# Patient Record
Sex: Female | Born: 1965 | Race: White | Hispanic: No | Smoking: Never smoker
Health system: Southern US, Community
[De-identification: ages and names within clinical notes are randomized; demographics above are authoritative.]

## PROBLEM LIST (undated history)

## (undated) DIAGNOSIS — I82409 Acute embolism and thrombosis of unspecified deep veins of unspecified lower extremity: Secondary | ICD-10-CM

## (undated) HISTORY — PX: LAPAROSCOPY: SHX197

## (undated) HISTORY — DX: Acute embolism and thrombosis of unspecified deep veins of unspecified lower extremity: I82.409

---

## 2007-10-31 ENCOUNTER — Other Ambulatory Visit: Admission: RE | Admit: 2007-10-31 | Discharge: 2007-10-31 | Payer: Self-pay | Admitting: Obstetrics and Gynecology

## 2008-03-01 HISTORY — PX: BREAST ENHANCEMENT SURGERY: SHX7

## 2008-11-04 ENCOUNTER — Other Ambulatory Visit: Admission: RE | Admit: 2008-11-04 | Discharge: 2008-11-04 | Payer: Self-pay | Admitting: Obstetrics and Gynecology

## 2012-10-17 ENCOUNTER — Encounter: Payer: Self-pay | Admitting: Certified Nurse Midwife

## 2012-10-22 ENCOUNTER — Ambulatory Visit (INDEPENDENT_AMBULATORY_CARE_PROVIDER_SITE_OTHER): Payer: BC Managed Care – PPO | Admitting: Certified Nurse Midwife

## 2012-10-22 ENCOUNTER — Encounter: Payer: Self-pay | Admitting: Certified Nurse Midwife

## 2012-10-22 VITALS — BP 110/64 | HR 68 | Resp 16

## 2012-10-22 DIAGNOSIS — R319 Hematuria, unspecified: Secondary | ICD-10-CM

## 2012-10-22 DIAGNOSIS — N390 Urinary tract infection, site not specified: Secondary | ICD-10-CM

## 2012-10-22 LAB — POCT URINALYSIS DIPSTICK
Leukocytes, UA: NEGATIVE
Nitrite, UA: NEGATIVE
Protein, UA: NEGATIVE
Urobilinogen, UA: NEGATIVE
pH, UA: 5

## 2012-10-22 NOTE — Progress Notes (Addendum)
47 yo dwf [id number] here for f/u of E. Coli UTI treated with Cipro initiated on 09-10-2012.  Completed all me.d as directed.   Denies urinary frequency,urgency or pain.  Has increased water intake which has helped.  Does wear thong underwear, but has had no problems in the past. Sexually active but no concerns.  Empties bladder after sexual activity.  LMP:10-22-2012  O: Healthy female, WD WN  Alert, oriented x 3. HPI pertinent to above Skin: warm and dry CVAT: neg bilaterally Abdomen: soft, non-tender, negative suprapubic tenderness  No pelvic exam requested on menses  A: UTI probably resolved (E. Coli) UA reviewed neg.  P: Urine culture Continue to increase water daily to avoid UTI's RV prn Aware of UTI warning signs and symptoms Reviewed, TL

## 2012-12-12 ENCOUNTER — Ambulatory Visit: Payer: Self-pay | Admitting: Certified Nurse Midwife

## 2012-12-20 ENCOUNTER — Ambulatory Visit (INDEPENDENT_AMBULATORY_CARE_PROVIDER_SITE_OTHER): Payer: BC Managed Care – PPO | Admitting: Certified Nurse Midwife

## 2012-12-20 ENCOUNTER — Encounter: Payer: Self-pay | Admitting: Certified Nurse Midwife

## 2012-12-20 VITALS — BP 100/64 | Ht 66.75 in | Wt 173.0 lb

## 2012-12-20 DIAGNOSIS — Z0000 Encounter for general adult medical examination without abnormal findings: Secondary | ICD-10-CM

## 2012-12-20 DIAGNOSIS — Z01419 Encounter for gynecological examination (general) (routine) without abnormal findings: Secondary | ICD-10-CM

## 2012-12-20 LAB — LIPID PANEL
HDL: 53 mg/dL (ref >39)
Total CHOL/HDL Ratio: 2.8 Ratio
VLDL: 19 mg/dL (ref 0–40)

## 2012-12-20 LAB — POCT URINALYSIS DIPSTICK
Bilirubin, UA: NEGATIVE
Blood, UA: NEGATIVE
Ketones, UA: NEGATIVE
Leukocytes, UA: NEGATIVE
pH, UA: 5

## 2012-12-20 NOTE — Patient Instructions (Addendum)

## 2012-12-20 NOTE — Progress Notes (Signed)
47 y.o. G70P0010 Divorced Caucasian Fe here for annual exam.  Periods normal, no issues.  No partner change, no STD screening desired. Using diaphragm for contraception, brought with her today to make sure replacement not necessary.  No problems with fit or removal. Urgent care for problems prn.Fasted for lab work today.   No health issues today  Patient's last menstrual period was 12/11/2012.          Sexually active: yes  The current method of family planning is diaphragm.    Exercising: yes  walk & the gym Smoker:  no  Health Maintenance: Pap:  12-08-11 neg HPV HR neg MMG:  4/14 Colonoscopy:  none BMD:   none TDaP:  2013 Labs: Poct urine-neg Self breast exam: not done   reports that she has never smoked. She does not have any smokeless tobacco history on file. She reports that she does not drink alcohol or use illicit drugs.  Past Medical History  Diagnosis Date  . DVT (deep venous thrombosis)     rt calf treatment 8months due to oc's    Past Surgical History  Procedure Laterality Date  . Breast enhancement surgery Bilateral 8/09    saline  . Laparoscopy      No current outpatient prescriptions on file.   No current facility-administered medications for this visit.    Family History  Problem Relation Age of Onset  . Heart disease Mother   . Hodgkin's lymphoma Father     ROS:  Pertinent items are noted in HPI.  Otherwise, a comprehensive ROS was negative.  Exam:   BP 100/64  Ht 5' 6.75" (1.695 m)  Wt 173 lb (78.472 kg)  BMI 27.31 kg/m2  LMP 12/11/2012 Height: 5' 6.75" (169.5 cm)  Ht Readings from Last 3 Encounters:  12/20/12 5' 6.75" (1.695 m)    General appearance: alert, cooperative and appears stated age Head: Normocephalic, without obvious abnormality, atraumatic Neck: no adenopathy, supple, symmetrical, trachea midline and thyroid normal to inspection and palpation Lungs: clear to auscultation bilaterally Breasts: normal appearance, no masses or  tenderness, No nipple retraction or dimpling, No nipple discharge or bleeding, No axillary or supraclavicular adenopathy, implants appear intact Heart: regular rate and rhythm Abdomen: soft, non-tender; no masses,  no organomegaly Extremities: extremities normal, atraumatic, no cyanosis or edema Skin: Skin color, texture, turgor normal. No rashes or lesions Lymph nodes: Cervical, supraclavicular, and axillary nodes normal. No abnormal inguinal nodes palpated Neurologic: Grossly normal   Pelvic: External genitalia:  no lesions              Urethra:  normal appearing urethra with no masses, tenderness or lesions              Bartholin's and Skene's: normal                 Vagina: normal appearing vagina with normal color and discharge, no lesions              Cervix: normal non tender              Pap taken: no Bimanual Exam:  Uterus:  normal size, contour, position, consistency, mobility, non-tender and anteverted              Adnexa: normal adnexa and no mass, fullness, tenderness               Rectovaginal: Confirms               Anus:  normal sphincter  tone, no lesions  A:  Well Woman with normal exam  Contraception: Diaphragm working well    P:  Reviewed health and wellness pertinent to exam  Checked diaphragm, reassured patient does not   need replacement at this time  Labs : Lipid panel, TSH  Pap smear as per guidelines   Mammogram yearly pap smear not taken today  counseled on breast self exam, adequate intake of calcium and vitamin D, diet and exercise  return annually or prn  An After Visit Summary was printed and given to the patient.  Reviewed, TL

## 2012-12-21 LAB — TSH: TSH: 1.845 u[IU]/mL (ref 0.350–4.500)

## 2012-12-25 ENCOUNTER — Telehealth: Payer: Self-pay

## 2012-12-25 NOTE — Telephone Encounter (Signed)
lmtcb

## 2012-12-25 NOTE — Telephone Encounter (Signed)
Message copied by Eliezer Bottom on Tue Dec 25, 2012  2:01 PM ------      Message from: Verner Chol      Created: Sun Dec 23, 2012  2:33 PM       Notify lipid panel excellent      TSH normal ------

## 2012-12-25 NOTE — Telephone Encounter (Signed)
Patient notified of results.

## 2013-01-24 ENCOUNTER — Telehealth: Payer: Self-pay | Admitting: Certified Nurse Midwife

## 2013-01-24 NOTE — Telephone Encounter (Signed)
Patient called C/O having 2 missed in a month . Please call and advise patient.

## 2013-01-24 NOTE — Telephone Encounter (Signed)
Spoke with pt who has had 2 periods this month. Pt says she is perimenopausal and knows this may be to be expected at her age. First period was the first week of June and it was a normal one. Second one started Sunday. Started out as dark blood, then bright red but very light flow, tapering down to spotting today. Pt reports she feels tired and run down, but otherwise OK. Wanted DL to know and for it to be documented. Is this to be expected? Any advice?

## 2013-01-29 NOTE — Telephone Encounter (Signed)
Spoke with pt to advise her that DL would like her to come in for OV. Pt wondering why DL thinks she needs to come in. Pt thought irregular cycles were to be expected in perimenopause. She mostly called [place] just for it to be documented on her record. Pt had one other instance of irregular cycle last summer, and 2 cycles last month, but that is all. Pt wondering if DL would have time to call her for 5 minutes.

## 2013-01-29 NOTE — Telephone Encounter (Signed)
Needs ov

## 2013-01-30 ENCOUNTER — Telehealth: Payer: Self-pay | Admitting: Certified Nurse Midwife

## 2013-01-30 NOTE — Telephone Encounter (Signed)
47 yo calling just to document in her chart that she had a change in period cycle for the past two months. Occurred normal time in cycle and 2 weeks later with just a light day.  No issues with cramping or heavy flow.  Reassured normal for perimenopausal changes. Will continue to keep menses calendar and advise if change. Rv prn

## 2013-03-07 ENCOUNTER — Ambulatory Visit (INDEPENDENT_AMBULATORY_CARE_PROVIDER_SITE_OTHER): Payer: BC Managed Care – PPO | Admitting: Certified Nurse Midwife

## 2013-03-07 VITALS — BP 110/62 | HR 64 | Resp 16 | Ht 66.75 in | Wt 172.0 lb

## 2013-03-07 DIAGNOSIS — B3731 Acute candidiasis of vulva and vagina: Secondary | ICD-10-CM

## 2013-03-07 DIAGNOSIS — B373 Candidiasis of vulva and vagina: Secondary | ICD-10-CM

## 2013-03-07 MED ORDER — TERCONAZOLE 0.4 % VA CREA: 1.0000 | TOPICAL_CREAM | Freq: Every day | VAGINAL | Status: DC

## 2013-03-07 NOTE — Progress Notes (Signed)
47 y.o.DivorcedCaucasian female G1P0010 with a 2 day(s) history of the following:discharge described as white and irritated feeling, odor and vulvar itching Sexually active: yes Last sexual activity:5days ago. Pt also reports the following associated symptoms: none Patient has not tried  OTC. No new personal products. Contraception, diaphragm working well consistent use.    Exam:  ZOX:WRUEAV, Bartholin's, Urethra, Skene's normal, slight increase in pink skin color, no lesions                Vag:no lesions, discharge: white and thick, pH 4.5, wet prep done                Cx:  normal appearance and non tender                Uterus:normal size, non-tender, normal shape and consistency                Adnexa: normal adnexa and no mass, fullness, tenderness  Wet Prep shows: Positive for yeast,, negative for BV, or trich  A: Yeast vaginitis 2-Amenorrhea but has history of irregular cycles.  P:  Revieiwed findings of yeast Rx Terazol 7 see order. Questions addressed. Aveeno or baking soda sitz bath prn comfort. 2-Patient will advise if no menses and do home UPT. Declines here today.   Rv prn

## 2013-03-07 NOTE — Progress Notes (Signed)
Note reviewed, agree with plan.  Lada Fulbright, MD  

## 2013-03-13 ENCOUNTER — Telehealth: Payer: Self-pay | Admitting: Certified Nurse Midwife

## 2013-03-13 NOTE — Telephone Encounter (Signed)
Patient last OV 03/07/2013 with Ortencia Kick for yeast infection and Tx. Patient calling to day concern of not having cycle for July and no cycle yet for this month. States her last cycle was June 8th - June 11th of regular lite bleeding and a cycle on June 22nd -June 25th of very lite bleeding x 1 day. Patient did purchase home EPT testing but has not done yet . Stated on the handout with it states that if you are premenopausal it could give a false positive. Patient states that she does not feel any symptoms of pregnancy at this time but would rather have reassurance of a true test of pregnancy. Patient also concern she will be doing last Terazol Tx  Tonight and did not know if that could give a false positive also. Patient states she is having a lot of discharge from Tx of Terazol which she expected. Please advise.

## 2013-03-13 NOTE — Telephone Encounter (Signed)
We discussed due to bleeding periods in June she may not have developed enough lining to have menses in July. Finish Terazol cream and then collect first morning urine to do pregnancy test.  I doubt it will positive with her use of diaphragm, probably as we discussed perimenopausal changes. Have patient advise of pregnancy test results.

## 2013-03-13 NOTE — Telephone Encounter (Signed)
Left message on CB# to return call to our office.

## 2013-03-13 NOTE — Telephone Encounter (Signed)
Patient notified of response from Ortencia Kick. Patient states she will do pregnancy test on Friday morning and call with results.

## 2013-03-13 NOTE — Telephone Encounter (Signed)
Needs to speak to Brenda Mosley about her periods. Same issues as last week. Hasn't started yet.

## 2013-03-15 ENCOUNTER — Telehealth: Payer: Self-pay | Admitting: *Deleted

## 2013-03-15 NOTE — Telephone Encounter (Signed)
Patient calling back today of report of negative HCG test. Patient will call and let know if starts a cycle this month or when starts. Patient also keeping calendar of her cycles,also.

## 2013-03-18 NOTE — Telephone Encounter (Signed)
agree

## 2013-03-20 NOTE — Telephone Encounter (Signed)
Pt wanted to let debbie know that her period started again today.

## 2013-03-20 NOTE — Telephone Encounter (Signed)
Patient called to report to D. Darcel Bayley of her cycle starting today . Normal menses ,bright red, using regular tampon, some cramping. Patient states will call next month if cycle begins or does not appear.

## 2013-04-23 ENCOUNTER — Telehealth: Payer: Self-pay | Admitting: Certified Nurse Midwife

## 2013-04-23 NOTE — Telephone Encounter (Signed)
Patient isnt sure if she has in infection or not but she is having pain in her lower back on the left side. She doesn't have and burning or smell dc or anything urine is a light color. Has been going on through the weekend and hasn't gotten any better.

## 2013-04-23 NOTE — Telephone Encounter (Signed)
Spoke with pt about left side pain that comes and goes in her lower back. Uncomfortable for pt to sit very long. Has noticed pain since the weekend. Pt wants to rule out urinary cause. Offered OV today, but pt declined as she is at work. Sched OV with DL tomorrow at 14:78.

## 2013-04-24 ENCOUNTER — Ambulatory Visit (INDEPENDENT_AMBULATORY_CARE_PROVIDER_SITE_OTHER): Payer: BC Managed Care – PPO | Admitting: Certified Nurse Midwife

## 2013-04-24 VITALS — BP 110/60 | HR 72 | Temp 98.2°F | Ht 66.75 in | Wt 175.0 lb

## 2013-04-24 DIAGNOSIS — N390 Urinary tract infection, site not specified: Secondary | ICD-10-CM

## 2013-04-24 DIAGNOSIS — R109 Unspecified abdominal pain: Secondary | ICD-10-CM

## 2013-04-24 LAB — POCT URINALYSIS DIPSTICK
Bilirubin, UA: NEGATIVE
Blood, UA: NEGATIVE
Nitrite, UA: NEGATIVE
Protein, UA: NEGATIVE
pH, UA: 5

## 2013-04-24 MED ORDER — CIPROFLOXACIN HCL 500 MG PO TABS: 500.0000 mg | ORAL_TABLET | Freq: Two times a day (BID) | ORAL | Status: DC

## 2013-04-24 MED ORDER — PHENAZOPYRIDINE HCL 200 MG PO TABS: 200.0000 mg | ORAL_TABLET | Freq: Three times a day (TID) | ORAL | Status: DC | PRN

## 2013-04-24 MED ORDER — CIPROFLOXACIN 500 MG/5ML (10%) PO SUSR: 500.0000 mg | Freq: Two times a day (BID) | ORAL | Status: DC

## 2013-04-24 NOTE — Telephone Encounter (Signed)
Pt says [facility] on [address] will not have the liquid Cipro in until tomorrow. Would like to have it called into the [facility] [address] at [phone].

## 2013-04-24 NOTE — Progress Notes (Signed)
S:  @47  y.o.Divorced Caucasian female presents with UTI } symptoms of back pain, urinary frequency, urinary urgency, slight fatigue. Denies fever, chills, headache or blood in urine. Onset of symptoms 5 days ago after spending time at [employer] [place] [employer].{Symptoms not related to sexual activity. Last UTI documented was 10/04/12 with E. Coli positive culture.  ROS: no weight loss, fever, night sweats and fatigued  O alert, oriented to person, place, and time   healthy,  alert,  not in acute distress, well developed and well nourished  Skin: warm and dry  Positive for CVAT on left Abdomen:Positive suprapubic External genital area: normal Urethra/urethral meatus non tender Bladder: tender Vagina: normal discharge Cervix: non tender Uterus: normal, non tender Adnexa: normal, non tender  POCTurine 2+ leukocytes  A:UTI  P: Reviewed findings of UTI. Rx Cipro see order Rx Pyridium see order Lab: Urine micro and culture  Maintain adequate hydration. Follow up if symptoms not improving, and as needed.   Rv prn or 2 weeks TOC if culture positive

## 2013-04-24 NOTE — Telephone Encounter (Signed)
Spoke with patient. Unable to find 10% suspension and found [facility] [address] had 5% but pharmacy advised it would most likely be expensive and they had only 5 days worth in stock. Advised patient and [facility]ist who states that she can crush pill and place in yogurt. Changed Rx to pills and [facility] to [facility] per patient request.

## 2013-04-25 LAB — URINALYSIS, MICROSCOPIC ONLY: Casts: NONE SEEN

## 2013-04-26 LAB — URINE CULTURE: Colony Count: >=100000

## 2013-04-28 NOTE — Progress Notes (Signed)
Note reviewed, agree with plan.  Linnaea Ahn, MD  

## 2013-04-29 ENCOUNTER — Telehealth: Payer: Self-pay | Admitting: Certified Nurse Midwife

## 2013-04-29 NOTE — Telephone Encounter (Signed)
Patient is calling about some results from testing she had one on Wednesday last week.

## 2013-04-29 NOTE — Telephone Encounter (Signed)
Notes Recorded by Lauro Franklin, FNP on 04/26/2013 at 12:38 PM Let patient know that urine culture was positive - she is on the correct antibiotic that Ms. [name] gave her. She will need TOC in 2 weeks.      Notified patient. TOC appointment scheduled.

## 2013-05-23 ENCOUNTER — Ambulatory Visit (INDEPENDENT_AMBULATORY_CARE_PROVIDER_SITE_OTHER): Payer: BC Managed Care – PPO | Admitting: *Deleted

## 2013-05-23 VITALS — BP 122/76 | HR 72 | Resp 16 | Ht 66.75 in | Wt 175.0 lb

## 2013-05-23 DIAGNOSIS — N390 Urinary tract infection, site not specified: Secondary | ICD-10-CM

## 2013-05-25 LAB — URINE CULTURE
Colony Count: NO GROWTH
Organism ID, Bacteria: NO GROWTH

## 2013-05-27 ENCOUNTER — Telehealth: Payer: Self-pay | Admitting: Certified Nurse Midwife

## 2013-05-27 NOTE — Telephone Encounter (Signed)
Patient calling for recent u/a results please?

## 2013-05-28 ENCOUNTER — Telehealth: Payer: Self-pay

## 2013-05-28 NOTE — Telephone Encounter (Signed)
Opened in error

## 2013-05-28 NOTE — Telephone Encounter (Signed)
Returned patient call. Lab results given, verbalized understanding and will follow up prn.

## 2013-08-20 ENCOUNTER — Telehealth: Payer: Self-pay | Admitting: Certified Nurse Midwife

## 2013-08-20 NOTE — Telephone Encounter (Signed)
Patient left diaphragm in while swimming and wants to speak with nurse about possible risks.

## 2013-08-20 NOTE — Telephone Encounter (Signed)
Patient was returning a call to Brenda Mosley

## 2013-08-20 NOTE — Telephone Encounter (Signed)
LMTCB

## 2013-08-21 ENCOUNTER — Encounter: Payer: Self-pay | Admitting: Certified Nurse Midwife

## 2013-08-21 NOTE — Telephone Encounter (Signed)
Spoke with pt who went to the [place] and swam in a chlorinated pool. Pt is worried that the chlorine could have possibly compromised the diaphragm as she was wearing it while swimming. It had some areas of discoloration before that were "bleached out" by the chlorine. Please advise.

## 2013-08-21 NOTE — Telephone Encounter (Signed)
LM on pt's VM to advise per message from DL below. Pt to call back with any problems or questions.

## 2013-08-21 NOTE — Telephone Encounter (Signed)
If patient does not see any obvious pin holes or tears in rubber or if she fills with water and none leaks through it should be fine. If any of these exist she needs new RX.

## 2013-08-26 ENCOUNTER — Telehealth: Payer: Self-pay | Admitting: Certified Nurse Midwife

## 2013-08-26 NOTE — Telephone Encounter (Signed)
Spoke with patient. She states she has been having diarrhea each time she eats since she has been back from [place] on 08/18/13. She states she has abdominal pain with each bowel movement and they are loose/soft each time. She has been taking [place] and it is not improving .Wondering if it has something to do with her colon.  Denies fevers. Able to tolerate foods/fluids but then had diarrhea directly after. No blood in stool per patient.  Advised patient that she should seek care with pcp or urgent care as this has been an ongoing issue. She states she does not have a primary care provider and uses us as primary care. States "I don't like going to urgent care." I advised urgent care may be best as they are able to do imaging, stool cultures if needed and possibly IV hydration. Advised that we are GYN office and that all of these symptoms warrant evaluation by PCP and if sudden or increasing, change from her normal, recommend urgent care/ED immediately.  Routing to Debbi/Dr. Hyacinth MeekerMiller as well.

## 2013-08-26 NOTE — Telephone Encounter (Signed)
agree

## 2013-08-26 NOTE — Telephone Encounter (Signed)
Agree she needs to follow up with PCP or go to urgent care.  Encounter closed.

## 2013-08-26 NOTE — Telephone Encounter (Signed)
Patient said she just just got back [place] and ever since she has been having bowel issues. Diarrhea,spasms when she goes etc.

## 2013-12-26 ENCOUNTER — Ambulatory Visit: Payer: BC Managed Care – PPO | Admitting: Certified Nurse Midwife

## 2014-01-07 ENCOUNTER — Ambulatory Visit (INDEPENDENT_AMBULATORY_CARE_PROVIDER_SITE_OTHER): Payer: BC Managed Care – PPO | Admitting: Certified Nurse Midwife

## 2014-01-07 ENCOUNTER — Encounter: Payer: Self-pay | Admitting: Certified Nurse Midwife

## 2014-01-07 VITALS — BP 110/70 | HR 70 | Temp 98.6°F | Resp 16 | Ht 66.75 in | Wt 180.0 lb

## 2014-01-07 DIAGNOSIS — N390 Urinary tract infection, site not specified: Secondary | ICD-10-CM

## 2014-01-07 DIAGNOSIS — Z0000 Encounter for general adult medical examination without abnormal findings: Secondary | ICD-10-CM

## 2014-01-07 DIAGNOSIS — Z124 Encounter for screening for malignant neoplasm of cervix: Secondary | ICD-10-CM

## 2014-01-07 DIAGNOSIS — Z01419 Encounter for gynecological examination (general) (routine) without abnormal findings: Secondary | ICD-10-CM

## 2014-01-07 LAB — POCT URINALYSIS DIPSTICK
BILIRUBIN UA: NEGATIVE
Blood, UA: NEGATIVE
GLUCOSE UA: NEGATIVE
Ketones, UA: NEGATIVE
Nitrite, UA: POSITIVE
Protein, UA: NEGATIVE
Urobilinogen, UA: NEGATIVE
pH, UA: 5

## 2014-01-07 MED ORDER — CIPROFLOXACIN 500 MG/5ML (10%) PO SUSR: ORAL | Status: DC

## 2014-01-07 NOTE — Patient Instructions (Addendum)
EXERCISE AND DIET:  We recommended that you start or continue a regular exercise program for good health. Regular exercise means any activity that makes your heart beat faster and makes you sweat.  We recommend exercising at least 30 minutes per day at least 3 days a week, preferably 4 or 5.  We also recommend a diet low in fat and sugar.  Inactivity, poor dietary choices and obesity can cause diabetes, heart attack, stroke, and kidney damage, among others.    ALCOHOL AND SMOKING:  Women should limit their alcohol intake to no more than 7 drinks/beers/glasses of wine (combined, not each!) per week. Moderation of alcohol intake to this level decreases your risk of breast cancer and liver damage. And of course, no recreational drugs are part of a healthy lifestyle.  And absolutely no smoking or even second hand smoke. Most people know smoking can cause heart and lung diseases, but did you know it also contributes to weakening of your bones? Aging of your skin?  Yellowing of your teeth and nails?  CALCIUM AND VITAMIN D:  Adequate intake of calcium and Vitamin D are recommended.  The recommendations for exact amounts of these supplements seem to change often, but generally speaking 600 mg of calcium (either carbonate or citrate) and 800 units of Vitamin D per day seems prudent. Certain women may benefit from higher intake of Vitamin D.  If you are among these women, your doctor will have told you during your visit.    PAP SMEARS:  Pap smears, to check for cervical cancer or precancers,  have traditionally been done yearly, although recent scientific advances have shown that most women can have pap smears less often.  However, every woman still should have a physical exam from her gynecologist every year. It will include a breast check, inspection of the vulva and vagina to check for abnormal growths or skin changes, a visual exam of the cervix, and then an exam to evaluate the size and shape of the uterus and  ovaries.  And after 50 years of age, a rectal exam is indicated to check for rectal cancers. We will also provide age appropriate advice regarding health maintenance, like when you should have certain vaccines, screening for sexually transmitted diseases, bone density testing, colonoscopy, mammograms, etc.   MAMMOGRAMS:  All women over 40 years old should have a yearly mammogram. Many facilities now offer a "3D" mammogram, which may cost around $50 extra out of pocket. If possible,  we recommend you accept the option to have the 3D mammogram performed.  It both reduces the number of women who will be called back for extra views which then turn out to be normal, and it is better than the routine mammogram at detecting truly abnormal areas.    COLONOSCOPY:  Colonoscopy to screen for colon cancer is recommended for all women at age 50.  We know, you hate the idea of the prep.  We agree, BUT, having colon cancer and not knowing it is worse!!  Colon cancer so often starts as a polyp that can be seen and removed at colonscopy, which can quite literally save your life!  And if your first colonoscopy is normal and you have no family history of colon cancer, most women don't have to have it again for 10 years.  Once every ten years, you can do something that may end up saving your life, right?  We will be happy to help you get it scheduled when you are ready.    Be sure to check your insurance coverage so you understand how much it will cost.  It may be covered as a preventative service at no cost, but you should check your particular policy.     Asymptomatic Bacteriuria, Female Asymptomatic bacteriuria is a significant number of bacteria in your urine that occur without the usual symptoms of burning or frequent urination. The following conditions increase risk of asymptomatic bacteriuria:  Diabetes mellitus.  Advanced age.  Pregnancy in the first trimester.  Kidney stones.  Kidney transplants.  Leaky  kidney tube valve in young children (reflux). Treatment for asymptomatic bacteriuria is not required in most people and can lead to other problems such as yeast overgrowth and development of resistant bacteria. However, some people, such as pregnant women, do need treatment to prevent kidney infection. Asymptomatic bacteriuria in pregnancy is also associated with fetal growth restriction, premature labor, and newborn death. HOME CARE INSTRUCTIONS Monitor your bacteriuria for any changes. The following actions may help to alleviate any discomfort you are experiencing:  Drink enough water and fluids to keep your urine clear or pale yellow. Go to the bathroom more frequently to keep your bladder empty.  Keep the area around your vagina and rectum clean. Wipe yourself from front to back after urinating. SEEK IMMEDIATE MEDICAL CARE IF:  You develop signs of an infection such asburning with urination, frequency of voiding, back pain, or fever.  You have blood in the urine.  You develop a fever. Document Released: 07/18/2005 Document Revised: 03/20/2013 Document Reviewed: 01/07/2013 [facility] Patient Information 2014 [place], [place].

## 2014-01-07 NOTE — Progress Notes (Signed)
48 y.o. G19P0010 Divorced Caucasian Fe here for annual exam. Periods essentially normal, has been keeping menses calendar.  Denies urinary frequency, urgency, or burning with urination. Denies back pain or fever or chills. History of E.Coli UTI in past with Cipro use with good results. Drinking adequate water daily. Contraception working well, no issues with diaphragm. No partner change, no STD concerns. Social stress with sister regarding mother's care, seeking legal and medical assistance. No other health issues. Sees PCP for aex and labs.   Patient's last menstrual period was 12/19/2013.          Sexually active: yes  The current method of family planning is diaphragm.    Exercising: yes  body pump, treadmill Smoker:  no  Health Maintenance: Pap: 12-08-11 neg HPV HR neg MMG: 11-18-13 normal Colonoscopy:  none BMD:   none TDaP: 2013 Labs: Poct urine- wbc 1+, nitrite-pos Self breast exam: not done   reports that she has never smoked. She does not have any smokeless tobacco history on file. She reports that she does not drink alcohol or use illicit drugs.  Past Medical History  Diagnosis Date  . DVT (deep venous thrombosis)     rt calf treatment 35months due to oc's    Past Surgical History  Procedure Laterality Date  . Breast enhancement surgery Bilateral 8/09    saline  . Laparoscopy      No current outpatient prescriptions on file.   No current facility-administered medications for this visit.    Family History  Problem Relation Age of Onset  . Heart disease Mother   . Hodgkin's lymphoma Father     ROS:  Pertinent items are noted in HPI.  Otherwise, a comprehensive ROS was negative.  Exam:   BP 110/70  Pulse 70  Temp(Src) 98.6 F (37 C) (Oral)  Resp 16  Ht 5' 6.75" (1.695 m)  Wt 180 lb (81.647 kg)  BMI 28.42 kg/m2  LMP 12/19/2013 Height: 5' 6.75" (169.5 cm)  Ht Readings from Last 3 Encounters:  01/07/14 5' 6.75" (1.695 m)  05/23/13 5' 6.75" (1.695 m)  04/24/13  5' 6.75" (1.695 m)    General appearance: alert, cooperative and appears stated age Head: Normocephalic, without obvious abnormality, atraumatic Neck: no adenopathy, supple, symmetrical, trachea midline and thyroid normal to inspection and palpation and non-palpable Lungs: clear to auscultation bilaterally Breasts: normal appearance, no masses or tenderness, No nipple retraction or dimpling, No nipple discharge or bleeding, No axillary or supraclavicular adenopathy, implants present and feel intact Heart: regular rate and rhythm Abdomen: soft, non-tender; no masses,  no organomegaly Extremities: extremities normal, atraumatic, no cyanosis or edema Skin: Skin color, texture, turgor normal. No rashes or lesions Lymph nodes: Cervical, supraclavicular, and axillary nodes normal. No abnormal inguinal nodes palpated Neurologic: Grossly normal   Pelvic: External genitalia:  no lesions              Urethra:  normal appearing urethra with no masses, tenderness or lesions              Bartholin's and Skene's: normal                 Vagina: normal appearing vagina with normal color and discharge, no lesions              Cervix: normal, non tender              Pap taken: yes Bimanual Exam:  Uterus:  normal size, contour, position, consistency, mobility, non-tender and  anteverted              Adnexa: normal adnexa and no mass, fullness, tenderness               Rectovaginal: Confirms               Anus:  normal sphincter tone, no lesions  A:  Well Woman with normal exam  Contraception diaphragm  Asymptomatic UTI, history of E.Coli on culture  Social stress with family  P:   Reviewed health and wellness pertinent to exam  Aware of need to check diaphragm for wear and notify if needs replacement  Discussed findings and need for treatment. Patient agreeable. Patient to notify if symptoms occur  Rx Cipro see order  Lab: Urine micro and culture  Pap smear taken today with HPV reflex  Encouraged  to continue support from partner   counseled on STD prevention, HIV risk factors and prevention, adequate intake of calcium and vitamin D, diet and exercise  return annually or prn  An After Visit Summary was printed and given to the patient.

## 2014-01-08 LAB — URINALYSIS, MICROSCOPIC ONLY
Casts: NONE SEEN
Crystals: NONE SEEN

## 2014-01-08 LAB — IPS PAP TEST WITH REFLEX TO HPV

## 2014-01-09 LAB — URINE CULTURE

## 2014-01-11 NOTE — Progress Notes (Signed)
Reviewed personally.  M. Suzanne Mattheo Swindle, MD.  

## 2014-01-23 ENCOUNTER — Ambulatory Visit (INDEPENDENT_AMBULATORY_CARE_PROVIDER_SITE_OTHER): Payer: BC Managed Care – PPO | Admitting: *Deleted

## 2014-01-23 ENCOUNTER — Telehealth: Payer: Self-pay | Admitting: Certified Nurse Midwife

## 2014-01-23 VITALS — BP 118/60 | HR 60 | Ht 67.0 in | Wt 181.0 lb

## 2014-01-23 DIAGNOSIS — N390 Urinary tract infection, site not specified: Secondary | ICD-10-CM

## 2014-01-23 NOTE — Telephone Encounter (Signed)
Left detailed message at number provided. Okay per ROI. Advised patient that results are still pending from today's visit. Advised could take a couple of days before results are back. Advised as soon as results are back and reviewed by the Arneta Mahmood we will be in contact to provide results. Advised if any further questions to call back at [phone].  Routing to Terissa Haffey for final review. Patient agreeable to disposition. Will close encounter

## 2014-01-23 NOTE — Telephone Encounter (Signed)
Patient is calling for "follow up results".

## 2014-01-23 NOTE — Progress Notes (Signed)
Patient came in for an Urine TOC Patient is not having any sx and has finished the antibiotics Sent culture to lab     Encounter close

## 2014-01-24 LAB — URINE CULTURE
COLONY COUNT: NO GROWTH
Organism ID, Bacteria: NO GROWTH

## 2014-02-07 ENCOUNTER — Telehealth: Payer: Self-pay | Admitting: Certified Nurse Midwife

## 2014-02-07 DIAGNOSIS — Z1211 Encounter for screening for malignant neoplasm of colon: Secondary | ICD-10-CM

## 2014-02-07 NOTE — Telephone Encounter (Signed)
Lmtcb//kn 

## 2014-02-07 NOTE — Telephone Encounter (Signed)
Pt not sure if she should set up a colonoscopy appointment or if our office can send a referral somewhere for her.

## 2014-02-10 NOTE — Telephone Encounter (Signed)
Patient is returning a call. °

## 2014-02-11 NOTE — Telephone Encounter (Signed)
Yes for colonoscopy and Dr Loreta AveMann

## 2014-02-11 NOTE — Telephone Encounter (Signed)
Patient has not seen a gastroenterologist before said she is not ready to do the colonoscopy yet would like to do it in October or November.

## 2014-02-11 NOTE — Telephone Encounter (Signed)
Calling patient back. Need to know if patient has seen a SolicitorGastroenterologist before.  If not, pended referral to Dr. Loreta AveMann.

## 2014-02-11 NOTE — Telephone Encounter (Signed)
Debbi, okay to place referral for colonoscopy? Ok to refer to Dr. Loreta AveMann?

## 2014-02-12 NOTE — Telephone Encounter (Signed)
Martie LeeSabrina can you complete referral to Dr. Loreta AveMann. Patient does not want to schedule until October/November 2015.

## 2014-05-15 ENCOUNTER — Telehealth: Payer: Self-pay | Admitting: Certified Nurse Midwife

## 2014-05-15 NOTE — Telephone Encounter (Signed)
Pt is calling about billing.

## 2014-05-21 ENCOUNTER — Telehealth: Payer: Self-pay | Admitting: Certified Nurse Midwife

## 2014-05-21 ENCOUNTER — Encounter: Payer: Self-pay | Admitting: Certified Nurse Midwife

## 2014-05-21 ENCOUNTER — Ambulatory Visit (INDEPENDENT_AMBULATORY_CARE_PROVIDER_SITE_OTHER): Payer: BC Managed Care – PPO | Admitting: Certified Nurse Midwife

## 2014-05-21 VITALS — BP 110/72 | HR 70 | Resp 16 | Ht 66.75 in | Wt 182.0 lb

## 2014-05-21 DIAGNOSIS — B3731 Acute candidiasis of vulva and vagina: Secondary | ICD-10-CM

## 2014-05-21 DIAGNOSIS — B373 Candidiasis of vulva and vagina: Secondary | ICD-10-CM

## 2014-05-21 MED ORDER — TERCONAZOLE 0.4 % VA CREA: 1.0000 | TOPICAL_CREAM | Freq: Every day | VAGINAL | Status: DC

## 2014-05-21 NOTE — Progress Notes (Signed)
48 y.o. divorced white female g1p0010 here with complaint of vaginal symptoms of itching, burning, and increase discharge. Describes discharge as white thick,no odor.. Onset of symptoms 4 days ago. Denies new personal products or vaginal dryness. Patient now working out in gym and staying in moist clothes longer. Contraception Diaphragm, which has not caused any problems. No STD concerns. Urinary symptoms none .   O:Healthy female WDWN Affect: normal, orientation x 3  Exam: Abdomen: Lymph node: no enlargement or tenderness Pelvic exam: External genital: slight red in appearance, no exudate or lesions BUS: negative Vagina: white thin non odorous discharge noted. Ph:4.0   ,Wet prep taken Cervix: normal, non tender Uterus: normal, non tender Adnexa:normal, non tender, no masses or fullness noted   Wet Prep results: positive yeast   A:Yeast Vaginitis Contraception Diaphragm   P:Discussed findings of yeast vaginitis and etiology. Discussed Aveeno or baking soda sitz bath for comfort. Avoid moist clothes or pads for extended period of time. If working out in gym clothes for long periods of time change underwear  if possible. Olive Oil use for skin protection prior to activity can be used to external skin.  Rx: Terazol 7  Rv prn

## 2014-05-21 NOTE — Patient Instructions (Signed)

## 2014-05-21 NOTE — Telephone Encounter (Signed)
error 

## 2014-05-22 ENCOUNTER — Telehealth: Payer: Self-pay | Admitting: Certified Nurse Midwife

## 2014-05-22 NOTE — Telephone Encounter (Signed)
Spoke with patient. Patient states that she was seen yesterday for yeast infection. Has started mediation and "I feel a lot better. It is less red in the area. I was also calling to let her know that I bought whey to add to my milk in the morning and I am adding protein to my fruit. We think it may be related to my diet so I just wanted to let her know I am taking her advice." Advised patient woul dlet Verner Choleborah S. Leonard CNM know and that we are glad she is feeling better. Advised to call back if she needs anything. Patient agreeable.  Routing to provider for final review. Patient agreeable to disposition. Will close encounter

## 2014-05-22 NOTE — Telephone Encounter (Signed)
Patient was told to call and give info to [facility].

## 2014-05-23 NOTE — Progress Notes (Signed)
Reviewed personally.  M. Suzanne Felma Pfefferle, MD.  

## 2014-06-02 ENCOUNTER — Encounter: Payer: Self-pay | Admitting: Certified Nurse Midwife

## 2014-06-18 ENCOUNTER — Telehealth: Payer: Self-pay | Admitting: Certified Nurse Midwife

## 2014-06-23 NOTE — Telephone Encounter (Signed)
Patient is asking to talk with billing regarding reimbursement.

## 2014-06-25 NOTE — Telephone Encounter (Signed)
Health equity check for $126.34 was cashed from our office. Patient ask if would check on this and call her back today if possible. Patient is unsure why she still has a $8.00 balance.

## 2014-11-26 ENCOUNTER — Telehealth: Payer: Self-pay | Admitting: Certified Nurse Midwife

## 2014-11-26 NOTE — Telephone Encounter (Signed)
Pt having pre menopausal symptoms she would like to discuss.

## 2014-11-26 NOTE — Telephone Encounter (Signed)
Spoke with patient. Patient states she is perimenopausal and has been tracking her cycles and would like to give Verner Choleborah S. Leonard CNM an update. Had cycle 3/20-3/23 began spotting again on 4/9 then increased flow from 4/10-4/12, then started spotting again on 4/22-4/24. States that the time between her cycles is getting shorter and shorter. "I am not super concerned but did not know if I need to do anything before my annual in June." Aex scheduled for 6/14 at 8:30am with Verner Choleborah S. Leonard CNM. Advised would send a message over to Verner Choleborah S. Leonard CNM for review and return call with any further recommendations. Patient is agreeable. Would also like Verner Choleborah S. Leonard CNM to know she had her mammogram done on Monday and is going back on 4/29 for diagnostic of right breast.

## 2014-11-26 NOTE — Telephone Encounter (Signed)
I think her cycles can be evaluated in June unless bleeding long periods of time.

## 2014-11-26 NOTE — Telephone Encounter (Signed)
Left message to call Kaitlyn at [phone]. 

## 2014-11-27 NOTE — Telephone Encounter (Signed)
Left message to call Annalie Wenner at [phone]. 

## 2014-11-27 NOTE — Telephone Encounter (Signed)
Spoke with patient. Advised of message as seen below from Deborah S. Leonard CNM. Patient is agreeable and verbalizes understanding.  Routing to provider for final review. Patient agreeable to disposition. Will close encounter   

## 2014-11-27 NOTE — Telephone Encounter (Signed)
Pt returning call

## 2015-01-13 ENCOUNTER — Encounter: Payer: Self-pay | Admitting: Certified Nurse Midwife

## 2015-01-13 ENCOUNTER — Ambulatory Visit (INDEPENDENT_AMBULATORY_CARE_PROVIDER_SITE_OTHER): Payer: BC Managed Care – PPO | Admitting: Certified Nurse Midwife

## 2015-01-13 VITALS — BP 104/70 | HR 80 | Resp 16 | Ht 67.0 in | Wt 181.0 lb

## 2015-01-13 DIAGNOSIS — R319 Hematuria, unspecified: Secondary | ICD-10-CM

## 2015-01-13 DIAGNOSIS — Z01419 Encounter for gynecological examination (general) (routine) without abnormal findings: Secondary | ICD-10-CM | POA: Diagnosis not present

## 2015-01-13 DIAGNOSIS — Z0000 Encounter for general adult medical examination without abnormal findings: Secondary | ICD-10-CM | POA: Diagnosis not present

## 2015-01-13 LAB — COMPREHENSIVE METABOLIC PANEL
ALBUMIN: 4 g/dL (ref 3.5–5.2)
ALT: 17 U/L (ref 0–35)
AST: 23 U/L (ref 0–37)
Alkaline Phosphatase: 52 U/L (ref 39–117)
BUN: 11 mg/dL (ref 6–23)
CALCIUM: 10.1 mg/dL (ref 8.4–10.5)
CHLORIDE: 101 meq/L (ref 96–112)
CO2: 28 meq/L (ref 19–32)
Creat: 0.97 mg/dL (ref 0.50–1.10)
Glucose, Bld: 67 mg/dL — ABNORMAL LOW (ref 70–99)
Potassium: 3.9 mEq/L (ref 3.5–5.3)
Sodium: 138 mEq/L (ref 135–145)
TOTAL PROTEIN: 6.5 g/dL (ref 6.0–8.3)
Total Bilirubin: 1.2 mg/dL (ref 0.2–1.2)

## 2015-01-13 LAB — POCT URINALYSIS DIPSTICK
Bilirubin, UA: NEGATIVE
Glucose, UA: NEGATIVE
Ketones, UA: NEGATIVE
LEUKOCYTES UA: NEGATIVE
NITRITE UA: NEGATIVE
PROTEIN UA: NEGATIVE
Spec Grav, UA: 1.015
UROBILINOGEN UA: NEGATIVE
pH, UA: 6.5

## 2015-01-13 LAB — LIPID PANEL
CHOLESTEROL: 163 mg/dL (ref 0–200)
HDL: 60 mg/dL (ref >=46)
LDL CALC: 85 mg/dL (ref 0–99)
Total CHOL/HDL Ratio: 2.7 Ratio
Triglycerides: 90 mg/dL (ref <150)
VLDL: 18 mg/dL (ref 0–40)

## 2015-01-13 LAB — CBC
HCT: 44.2 % (ref 36.0–46.0)
HEMOGLOBIN: 14.4 g/dL (ref 12.0–15.0)
MCH: 30.6 pg (ref 26.0–34.0)
MCHC: 32.6 g/dL (ref 30.0–36.0)
MCV: 93.8 fL (ref 78.0–100.0)
MPV: 9.2 fL (ref 8.6–12.4)
Platelets: 201 10*3/uL (ref 150–400)
RBC: 4.71 MIL/uL (ref 3.87–5.11)
RDW: 12.7 % (ref 11.5–15.5)
WBC: 5.6 10*3/uL (ref 4.0–10.5)

## 2015-01-13 LAB — TSH: TSH: 2.367 u[IU]/mL (ref 0.350–4.500)

## 2015-01-13 NOTE — Patient Instructions (Signed)

## 2015-01-13 NOTE — Progress Notes (Signed)
Patient ID: Brenda Mosley, female   DOB: 08-11-1965, 49 y.o.   MRN: [id number]  49 y.o. G5P0010 Divorced  Caucasian Fe here for annual exam.  Periods normal, no issues. Occasional change with amount and duration, occasional spotting. Cramping minimal. Working on weight loss with exercise change. [facility]. Screening labs desired.. No other health issues today. Plans colonoscopy this year.   Patient's last menstrual period was 01/07/2015.          Sexually active: Yes.    The current method of family planning is diaphragm.    Exercising: Yes.    gym, treadmill weight machine  Smoker:  no  Health Maintenance: Pap:  01-07-14 WNL  MMG:  11-28-14 WNL Colonoscopy:  Never BMD:   Never TDaP:  12-08-11 Labs: U/A-++ Blood Self Breast Exams: Not done    reports that she has never smoked. She has never used smokeless tobacco. She reports that she does not drink alcohol or use illicit drugs.  Past Medical History  Diagnosis Date  . DVT (deep venous thrombosis)     rt calf treatment 8months due to oc's    Past Surgical History  Procedure Laterality Date  . Breast enhancement surgery Bilateral 8/09    saline  . Laparoscopy      Current Outpatient Prescriptions  Medication Sig Dispense Refill  . loratadine (CLARITIN) 10 MG tablet Take 10 mg by mouth daily.     No current facility-administered medications for this visit.    Family History  Problem Relation Age of Onset  . Heart disease Mother   . Hodgkin's lymphoma Father     ROS:  Pertinent items are noted in HPI.  Otherwise, a comprehensive ROS was negative.  Exam:   BP 104/70 mmHg  Pulse 80  Resp 16  Ht  (1.702 m)  Wt 181 lb (82.101 kg)  BMI 28.34 kg/m2  LMP 01/07/2015 Height:  (170.2 cm) Ht Readings from Last 3 Encounters:  01/13/15  (1.702 m)  05/21/14 5' 6.75" (1.695 m)  01/23/14  (1.702 m)    General appearance: alert, cooperative and appears stated age Head:  Normocephalic, without obvious abnormality, atraumatic Neck: no adenopathy, supple, symmetrical, trachea midline and thyroid normal to inspection and palpation Lungs: clear to auscultation bilaterally CVAT bilateral negative Breasts: normal appearance, no masses or tenderness, No nipple retraction or dimpling, No nipple discharge or bleeding, No axillary or supraclavicular adenopathy Heart: regular rate and rhythm Abdomen: soft, non-tender; no masses,  no organomegaly, negative suprapubic Extremities: extremities normal, atraumatic, no cyanosis or edema Skin: Skin color, texture, turgor normal. No rashes or lesions, warm and dry Lymph nodes: Cervical, supraclavicular, and axillary nodes normal. No abnormal inguinal nodes palpated Neurologic: Grossly normal   Pelvic: External genitalia:  no lesions              Urethra:  normal appearing urethra with no masses, tenderness or lesions  Bladder, urethral meatus non tender              Bartholin's and Skene's: normal                 Vagina: normal appearing vagina with normal color and discharge, no lesions              Cervix: normal, non tender no lesions              Pap taken: No. Bimanual Exam:  Uterus:  normal size, contour, position,  consistency, mobility, non-tender              Adnexa: normal adnexa and no mass, fullness, tenderness               Rectovaginal: Confirms               Anus:  normal sphincter tone, no lesions  Chaperone present: Yes  A:  Well Woman with normal exam  Contraception Diaphragm  Perimenopausal with slight cycle changes  R/O UTI  RBC 2+  P:   Reviewed health and wellness pertinent to exam  Fitting well, no changes with the size  Aware of UTI symptoms and will advise if occurs  Urine micro  Labs: CMP, Lipid panel,TSH, CBC. Vitamin D  Will call when ready to schedule colonoscopy  Pap smear no taken   counseled on breast self exam, mammography screening, adequate intake of calcium and vitamin D, diet  and exercise  return annually or prn  An After Visit Summary was printed and given to the patient.

## 2015-01-14 LAB — URINALYSIS, MICROSCOPIC ONLY
Casts: NONE SEEN
Crystals: NONE SEEN

## 2015-01-14 LAB — VITAMIN D 25 HYDROXY (VIT D DEFICIENCY, FRACTURES): VIT D 25 HYDROXY: 27 ng/mL — AB (ref 30–100)

## 2015-01-15 NOTE — Progress Notes (Signed)
Reviewed personally.  M. Suzanne Czarina Gingras, MD.  

## 2015-02-27 ENCOUNTER — Telehealth: Payer: Self-pay | Admitting: Certified Nurse Midwife

## 2015-02-27 NOTE — Telephone Encounter (Signed)
Spoke with patient. Patient states that she is having left lower sided back pain. Pain started this morning and has persisted even with applying heat. Has not taken any medication. Denies any urinary frequency, difficulty urinating, color change to urine, odor to urine, fever, or chills. "I may have pulled a muscle. I am not sure." Advised will need to be seen for further evaluation with PCP or Urgent Care before the weekend. States she does not have a PCP. Advised may be seen at local urgent care for evaluation and proper treatment as needed. Patient is agreeable and verbalizes understanding will be seen at urgent care today.  Routing to provider for final review. Patient agreeable to disposition. Will close encounter.   Patient aware provider will review message and nurse will return call if any additional advice or change of disposition.

## 2015-02-27 NOTE — Telephone Encounter (Signed)
Patient is having back pain on her left side.

## 2015-03-26 ENCOUNTER — Telehealth: Payer: Self-pay | Admitting: Certified Nurse Midwife

## 2015-03-26 NOTE — Telephone Encounter (Signed)
Patient is having some issues with bloating and thinks she may have a yeast infection.

## 2015-03-26 NOTE — Telephone Encounter (Signed)
Spoke with patient. States she has been experiencing intermittent lower back discomfort. Is having a slight vaginal discharge that began yesterday. Denies any vaginal itching, swelling, or irritation. Denies any pelvic or abdominal pain. Denies any urinary symptoms. Has also been experiencing hot flashes throughout the day. LMP was in June. "This may be related to menopause." Advised will need to be seen in office for further evaluation. Patient is going out of town today and requests to be seen on Monday. Appointment scheduled for 8/29 at 10:15am with Lauro Franklin, FNP as Verner Chol CNM will be out of the office. Patient is agreeable. Will be seen at local urgent care while traveling if symptoms increase or develops new symptoms.  Routing to provider for final review. Patient agreeable to disposition. Will close encounter.   Patient aware provider will review message and nurse will return call if any additional advice or change of disposition.

## 2015-03-30 ENCOUNTER — Encounter: Payer: Self-pay | Admitting: Nurse Practitioner

## 2015-03-30 ENCOUNTER — Ambulatory Visit (INDEPENDENT_AMBULATORY_CARE_PROVIDER_SITE_OTHER): Payer: BC Managed Care – PPO | Admitting: Nurse Practitioner

## 2015-03-30 VITALS — BP 120/80 | HR 70 | Resp 16 | Ht 67.0 in

## 2015-03-30 DIAGNOSIS — N912 Amenorrhea, unspecified: Secondary | ICD-10-CM

## 2015-03-30 DIAGNOSIS — N760 Acute vaginitis: Secondary | ICD-10-CM | POA: Diagnosis not present

## 2015-03-30 LAB — POCT URINALYSIS DIPSTICK
BILIRUBIN UA: NEGATIVE
Blood, UA: NEGATIVE
GLUCOSE UA: NEGATIVE
KETONES UA: NEGATIVE
LEUKOCYTES UA: NEGATIVE
Nitrite, UA: NEGATIVE
Protein, UA: NEGATIVE
Urobilinogen, UA: NEGATIVE
pH, UA: 5

## 2015-03-30 LAB — POCT URINE PREGNANCY: Preg Test, Ur: NEGATIVE

## 2015-03-30 MED ORDER — MEDROXYPROGESTERONE ACETATE 10 MG PO TABS: 10.0000 mg | ORAL_TABLET | Freq: Every day | ORAL | Status: DC

## 2015-03-30 NOTE — Patient Instructions (Signed)
Will call back with response to Provera

## 2015-03-30 NOTE — Progress Notes (Signed)
49 y.o.Divorced white G1P0010 here with complaint of vaginal symptoms of itching and increase discharge. Describes discharge as white last week and none this week.  Denies new personal products or vaginal dryness. No STD concerns, same partner for 3 years. Urinary symptoms none but some lower pelvic pressure and low back discomfort. Contraception is diaphragm and has been using this for over a year. UPT at home was negative.     Has also been experiencing hot flashes throughout the day. LMP was in June 8 lasting 3 days with moderate with light  flow and cramps with pressure.  PMP in May that was normal. No menses in July and August.     O:Healthy female WDWN Affect: normal, orientation x 3  Exam: alert, no distress Abdomen: soft and non tender Lymph node: no enlargement or tenderness Pelvic exam: External genital: normal female BUS: negative Vagina: thin white discharge noted. Affirm taken   Affirm UPT is negative UA chemstrip: negative   A: R/O vaginitis  Amenorrhea since June  Mild tolerable vaso symptoms  History of DVT   P: Discussed findings of vaginal discharge and etiology. Discussed Aveeno or baking soda sitz bath for comfort. Avoid moist clothes or pads for extended period of time. If working out in gym clothes or swim suits for long periods of time change underwear or bottoms of swimsuit if possible. Olive Oil/Coconut Oil use for skin protection prior to activity can be used to external skin.  Also discussed amenorrhea and treatment with a Provera challenge.  Rx: Provera 10 mg daily for 10 days and to call back with +/- response or abnormal long menses.  Will call her with Affirm test results  RV prn

## 2015-03-31 ENCOUNTER — Telehealth: Payer: Self-pay | Admitting: Certified Nurse Midwife

## 2015-03-31 ENCOUNTER — Other Ambulatory Visit: Payer: Self-pay | Admitting: Nurse Practitioner

## 2015-03-31 LAB — WET PREP BY MOLECULAR PROBE
Candida species: POSITIVE — AB
Gardnerella vaginalis: NEGATIVE
Trichomonas vaginosis: NEGATIVE

## 2015-03-31 MED ORDER — FLUCONAZOLE 150 MG PO TABS: 150.0000 mg | ORAL_TABLET | Freq: Once | ORAL | Status: DC

## 2015-03-31 NOTE — Telephone Encounter (Signed)
Pt notified in result note.  Closing encounter. 

## 2015-03-31 NOTE — Telephone Encounter (Signed)
Patient is calling to get her results from her recent visit.

## 2015-03-31 NOTE — Telephone Encounter (Signed)
Left message to call Kaitlyn at [phone].  Notes Recorded by Ria Comment, FNP on 03/31/2015 at 7:57 AM Please let pt know that Affirm was positive for yeast only and meds have been sent to the Pharmacy.

## 2015-04-01 NOTE — Progress Notes (Signed)
Encounter reviewed by Dr. Brook Amundson C. Silva.  

## 2015-04-13 ENCOUNTER — Telehealth: Payer: Self-pay | Admitting: Certified Nurse Midwife

## 2015-04-13 NOTE — Telephone Encounter (Signed)
Patient calling to give an update on her menstrual cycle.

## 2015-04-13 NOTE — Telephone Encounter (Signed)
Spoke with patient. Advised that I spoke with Brenda Comment, FNP and she needs to continue to monitor her bleeding. Needs to call if bleeding increases or is extended. Will also need to call if she misses her cycle for 3 months. Patient is agreeable.  Routing to provider for final review. Patient agreeable to disposition. Will close encounter.

## 2015-04-13 NOTE — Telephone Encounter (Signed)
Agree with the plan.

## 2015-04-13 NOTE — Telephone Encounter (Signed)
Spoke with patient. Patient states that she took Provera 10 mg from 8/23-9/1. Patient began to have some bleeding this morning. States that it is "dark and bright red." Is having light cramping. Bleeding is "moderate." Is wearing a super tampon but has not had to change it thus far today. States she is having right lower back pain. Denies any urinary symptoms. Advised she will need to continue to monitor her bleeding. If bleeding increases to having to change pad/tampon every hour due to bleeding through will need to let our office know. Advised if bleeding lasts longer than 7 days will also need to let our office know. Advised I will provider Ria Comment, FNP an update and return call with any further recommendations. Patient is agreeable.

## 2015-05-22 ENCOUNTER — Telehealth: Payer: Self-pay | Admitting: Certified Nurse Midwife

## 2015-05-22 NOTE — Telephone Encounter (Signed)
Patient called requested to see Ms. [name] for discharge/back pain symptoms. She wanted to wait until next week to see Ms. [name] because she thinks she's about to start her cycle sometime this weekend. Scheduled patient for OV 05/26/15 with Ms. [name].

## 2015-05-22 NOTE — Telephone Encounter (Signed)
yes

## 2015-05-22 NOTE — Telephone Encounter (Signed)
Routing to PepsiCoDeborah Leonard CNM as Lorain ChildesFYI. Okay to close encounter?

## 2015-05-26 ENCOUNTER — Encounter: Payer: Self-pay | Admitting: Certified Nurse Midwife

## 2015-05-26 ENCOUNTER — Ambulatory Visit (INDEPENDENT_AMBULATORY_CARE_PROVIDER_SITE_OTHER): Payer: BC Managed Care – PPO | Admitting: Certified Nurse Midwife

## 2015-05-26 VITALS — BP 128/82 | HR 80 | Temp 98.0°F | Resp 16 | Ht 67.0 in | Wt 184.8 lb

## 2015-05-26 DIAGNOSIS — R350 Frequency of micturition: Secondary | ICD-10-CM

## 2015-05-26 DIAGNOSIS — N951 Menopausal and female climacteric states: Secondary | ICD-10-CM

## 2015-05-26 DIAGNOSIS — N912 Amenorrhea, unspecified: Secondary | ICD-10-CM

## 2015-05-26 LAB — POCT URINALYSIS DIPSTICK
BILIRUBIN UA: NEGATIVE
GLUCOSE UA: NEGATIVE
KETONES UA: NEGATIVE
Leukocytes, UA: NEGATIVE
Nitrite, UA: NEGATIVE
PH UA: 5
Protein, UA: NEGATIVE
RBC UA: NEGATIVE
Urobilinogen, UA: NEGATIVE

## 2015-05-26 LAB — POCT URINE PREGNANCY: Preg Test, Ur: NEGATIVE

## 2015-05-26 LAB — TSH: TSH: 2.189 u[IU]/mL (ref 0.350–4.500)

## 2015-05-26 NOTE — Progress Notes (Signed)
  49 y.o.Divorced Caucasianfemale presents with no menses sinceSeptember 19, 2016. Previous use of Provera for amenorrhea with normal bleeding after completion. No period in 10/16 as of yet. Some urinary frequency last week, but also drinks fluids as late as 8 pm. Some cramping also then and now feels it maybe related to menses. Should have started then. Occasional hot flash, some insomnia, no night sweats. Denies dysuria or blood in urine, back pain, urgency or frequency now. No odor with urine or change in vaginal discharge. Just feel bloated with no menses which was this way previous when missed periods for 3 months. Use diaphragm for contraception and condom. No partner change or STD concerns.  No other concerns today.  Pertinent items are noted in HPI. General appearance: alert, cooperative, appears stated age and no distress  Skin: warm and dry Back: no CVAT bilateral Lungs: clear to auscultation bilaterally Abdomen: soft, non-tender; bowel sounds normal; no masses,  no organomegaly and negative suprapubic Pelvic: External genital: normal female, no lesions BUS: negative Bladder and urethral meatus non tender Vagina: normal white discharge, no odor, non tender Cervix: non tender, normal appearance Uterus: normal, non tender, no masses Adnexa: normal, non tender, no masses or fullness Rectal: normal appearance  Assessment:  Amenorrhea with negative UPT Urine dipstick:Negative no indication of UTI with exam also Perimenopausal with cycle changes with normal pelvic exam Contraception: Diaphragm, condoms  Plan: Discussed with patient factors that may be contributory to menstrual abnormalities include thyroid, perimenopause. Previous treatments for menstrual abnormalities include progesterone, effective with relief of symptoms. Discussed expectations with perimenopause again and also discussed Micronor daily use to help with symptoms related to cycle change. Risks and benefits given. Patient  worries "so much" about when or if period will start.  Questions addressed. Will consider use. Handout given on menopause. Will consider starting Micronor. Labs: FSH, TSH, AMH,Prolactin Discussed no indication of UTI and consuming fluids later in evening will contribute to nocturia and frequency. Patient feels better with coming in and all normal.  RV prn

## 2015-05-27 LAB — FOLLICLE STIMULATING HORMONE: FSH: 57.2 m[IU]/mL

## 2015-05-27 NOTE — Progress Notes (Signed)
Encounter reviewed Jill Jertson, MD   

## 2015-05-29 ENCOUNTER — Telehealth: Payer: Self-pay | Admitting: Certified Nurse Midwife

## 2015-05-29 LAB — ANTI MULLERIAN HORMONE: AMH AssessR: <0.03 ng/mL

## 2015-05-29 NOTE — Telephone Encounter (Signed)
Patient calling for recent lab results.

## 2015-05-29 NOTE — Telephone Encounter (Signed)
Sent note to be notified

## 2015-06-15 ENCOUNTER — Telehealth: Payer: Self-pay | Admitting: Certified Nurse Midwife

## 2015-06-15 MED ORDER — MEDROXYPROGESTERONE ACETATE 10 MG PO TABS: 10.0000 mg | ORAL_TABLET | Freq: Every day | ORAL | Status: DC

## 2015-06-15 NOTE — Telephone Encounter (Signed)
Spoke with patient. Patient states that she has not had her cycle since September. Advised will need to start on Provera at this time. Patient is agreeable. Rx for Provera 10 mg #10 0RF sent to pharmacy on file. Aware it can take up to 2 weeks after completing the last dose of Provera to have any bleeding. Will monitor and return call with positive or negative bleed.  Notes Recorded by Eliezer Bottomavina J Johnson, CMA on 06/03/2015 at 10:52 AM Patient notified of results as written by provider Notes Recorded by Verner Choleborah S Leonard, CNM on 06/02/2015 at 4:40 PM Notify patient that [facility], so can stop her diaphragm if she would like to. Needs to advise if no menses by mid November, if none will do Provera challenge. Then consider Micronor use  Routing to provider for final review. Patient agreeable to disposition. Will close encounter.

## 2015-06-15 NOTE — Telephone Encounter (Signed)
Patient called and said, "I was told to call if my menstrual cycle didn't start and it hasn't. I'll need some progesterone to get it to start." Pharmacy on file is correct.

## 2015-07-01 ENCOUNTER — Telehealth: Payer: Self-pay | Admitting: Certified Nurse Midwife

## 2015-07-01 NOTE — Telephone Encounter (Signed)
Spoke with patient. Patient states that she finished taking her Provera on 06/24/2015 and has not had any bleeding at this time. Advised it can take up to 14 days after completion of Provera to have any bleeding. Patient is agreeable and will monitor until 07/08/2015. Will return call to update us on if she did or did not have any bleeding.  Routing to provider for final review. Patient agreeable to disposition. Will close encounter.

## 2015-07-01 NOTE — Telephone Encounter (Signed)
Patient calling to give an update on a medicine she took to start her menstrual cycle.

## 2015-07-08 ENCOUNTER — Telehealth: Payer: Self-pay | Admitting: Certified Nurse Midwife

## 2015-07-08 MED ORDER — NORETHINDRONE 0.35 MG PO TABS: 1.0000 | ORAL_TABLET | Freq: Every day | ORAL | Status: DC

## 2015-07-08 NOTE — Telephone Encounter (Signed)
Patient's FSH indicated menopausal range, so she may not have any more bleeding, since no response to Provera challenge. Will do Micronor daily for 3 months, have patient advise if any bleeding during that time, if not she may not need to continue.

## 2015-07-08 NOTE — Telephone Encounter (Signed)
Patient took Provera 10 mg for 10 days. Last day of taking Provera was 06/24/2015. Patient has not had a cycle after completion. Per note below patient was to complete Provera challenge and then Micronor may be considered. Routing to PepsiCoDeborah Leonard CNM for review and advise.  Notes Recorded by Eliezer Bottomavina J Johnson, CMA on 06/03/2015 at 10:52 AM Patient notified of results as written by provider Notes Recorded by Verner Choleborah S Leonard, CNM on 06/02/2015 at 4:40 PM Notify patient that [facility], so can stop her diaphragm if she would like to. Needs to advise if no menses by mid November, if none will do Provera challenge. Then consider Micronor use

## 2015-07-08 NOTE — Telephone Encounter (Signed)
Patient has not had a period for 14 days after being on the progesterone pills.

## 2015-07-08 NOTE — Telephone Encounter (Signed)
Spoke with patient. Advised of message as seen below from PepsiCoDeborah Leonard CNM. Patient is agreeable and would like to start on Micronor at this time. Rx for Micronor 0.35 mg take one tablet by mouth daily #3 0RF sent to pharmacy on file. Patient is agreeable. Will return call if symptoms persist or any bleeding is noted during the three months on Micronor.  Routing to provider for final review. Patient agreeable to disposition. Will close encounter.

## 2015-07-08 NOTE — Telephone Encounter (Signed)
Left message to call Melodi Happel at [phone]. 

## 2015-07-08 NOTE — Telephone Encounter (Signed)
Spoke with patient. Advised of message as seen below from PepsiCoDeborah Leonard CNM. Patient states she has been experiencing "bloating and water retention" for while. Unable to tell me when symptoms began. States "I can not even get in some of my clothes any more. My hands, feet, and face are swollen." Denies any SOB or chest pain. Has been increasingly fatigued. States she has been eating "okay" and going to the gym. "This is just not normal for me. If the Micronor will help then I will take it, but I am not sure what to do." States she felt like this before she took the Provera last time and felt better after having a cycle. Advised I will speak with Leota Sauerseborah Leonard CNM and return call with further recommendations. Patient is agreeable.

## 2015-07-08 NOTE — Telephone Encounter (Signed)
The Micronor should help with this

## 2015-07-29 ENCOUNTER — Telehealth: Payer: Self-pay | Admitting: Certified Nurse Midwife

## 2015-07-29 NOTE — Telephone Encounter (Signed)
Patient says she started her cycle 12/24-12/26/2016 it was just enough for a regular tampon, on her last day she had light bleeding. She call Dr. Lily PeerFernandez and he advised her to take the Camila pill and that her cycle would only last 3 days which it did. Patient was advised to call our office to let Ms. [name] know. Best # to reach: [phone] for any questions.

## 2015-07-29 NOTE — Telephone Encounter (Signed)
I suspect she is having a cycle related to her episodes of amenorrhea. Sounds like she is finishing up cycle. If resolves no concerns. POP can cause spotting, if prolonged she needs to advise. Sounds like it is resolving her PMS symptoms. Other concerns she needs to call

## 2015-07-29 NOTE — Telephone Encounter (Signed)
Spoke with patient. Patient states that she is currently taking Micronor. On 07/25/2015 she began to have spotting that turned into the flow of a cycle. Patient called in and spoke with Dr.Fernandez. Was advised to continue taking Micronor at the same time daily and that spotting is not abnormal as "I may have had a little bit of lining left that needed to come out." Patient has been taking her pills at the same time daily and has not missed any pills. Patient is currently having spotting when she goes to the restroom. "I was told to let Eunice BlaseDebbie know if I had bleeding until Wednesday. I feel a lot better being on the Micronor. I used to be bloated but since starting it I feel a lot better." Advised I will speak with Leota Sauerseborah Leonard CNM and return call with additional recommendations. Patient is agreeable.

## 2015-07-29 NOTE — Telephone Encounter (Signed)
Spoke with patient. Advised of message as seen below from PepsiCoDeborah Leonard CNM. Patient is agreeable and verbalizes understanding. Will return call if symptoms persist or she has any other concerns.  Routing to provider for final review. Patient agreeable to disposition. Will close encounter.

## 2015-09-03 ENCOUNTER — Other Ambulatory Visit: Payer: Self-pay | Admitting: Certified Nurse Midwife

## 2015-09-03 MED ORDER — NORETHINDRONE 0.35 MG PO TABS: 1.0000 | ORAL_TABLET | Freq: Every day | ORAL | Status: DC

## 2015-09-03 NOTE — Telephone Encounter (Signed)
Patient states she was told to call in and report how she is doing before she can get her medication refilled. She is doing ok and the bloating is going down. She would like a 3 month prescription refill for Camila [facility]

## 2015-09-03 NOTE — Telephone Encounter (Signed)
Medication refill request: Brenda Mosley  Last AEX:  01-13-15 Next AEX: 01-21-16  Last MMG (if hormonal medication request): 11-28-14 Refill authorized: Please advise      Patient states she was told to call in and report how she is doing before she can get her medication refilled. She is doing ok and the bloating is going down. She would like a 3 month prescription refill for Brenda Mosley [facility]

## 2015-11-20 ENCOUNTER — Ambulatory Visit (INDEPENDENT_AMBULATORY_CARE_PROVIDER_SITE_OTHER): Payer: BC Managed Care – PPO | Admitting: Certified Nurse Midwife

## 2015-11-20 ENCOUNTER — Encounter: Payer: Self-pay | Admitting: Certified Nurse Midwife

## 2015-11-20 ENCOUNTER — Telehealth: Payer: Self-pay | Admitting: Certified Nurse Midwife

## 2015-11-20 VITALS — BP 110/72 | HR 70 | Resp 16 | Ht 67.0 in | Wt 194.0 lb

## 2015-11-20 DIAGNOSIS — R600 Localized edema: Secondary | ICD-10-CM | POA: Diagnosis not present

## 2015-11-20 DIAGNOSIS — N951 Menopausal and female climacteric states: Secondary | ICD-10-CM | POA: Diagnosis not present

## 2015-11-20 DIAGNOSIS — R140 Abdominal distension (gaseous): Secondary | ICD-10-CM | POA: Diagnosis not present

## 2015-11-20 DIAGNOSIS — N912 Amenorrhea, unspecified: Secondary | ICD-10-CM

## 2015-11-20 LAB — FOLLICLE STIMULATING HORMONE: FSH: 33 m[IU]/mL

## 2015-11-20 LAB — POCT URINALYSIS DIPSTICK
Bilirubin, UA: NEGATIVE
Blood, UA: NEGATIVE
Glucose, UA: NEGATIVE
Ketones, UA: NEGATIVE
Leukocytes, UA: NEGATIVE
Nitrite, UA: NEGATIVE
Protein, UA: NEGATIVE
Urobilinogen, UA: NEGATIVE
pH, UA: 5

## 2015-11-20 LAB — COMPREHENSIVE METABOLIC PANEL
ALBUMIN: 4.3 g/dL (ref 3.6–5.1)
ALT: 18 U/L (ref 6–29)
AST: 22 U/L (ref 10–35)
Alkaline Phosphatase: 55 U/L (ref 33–130)
BILIRUBIN TOTAL: 1.3 mg/dL — AB (ref 0.2–1.2)
BUN: 17 mg/dL (ref 7–25)
CO2: 24 mmol/L (ref 20–31)
CREATININE: 0.9 mg/dL (ref 0.50–1.05)
Calcium: 9.8 mg/dL (ref 8.6–10.4)
Chloride: 106 mmol/L (ref 98–110)
Glucose, Bld: 83 mg/dL (ref 65–99)
Potassium: 4 mmol/L (ref 3.5–5.3)
SODIUM: 139 mmol/L (ref 135–146)
TOTAL PROTEIN: 6.9 g/dL (ref 6.1–8.1)

## 2015-11-20 LAB — TSH: TSH: 2.39 m[IU]/L

## 2015-11-20 NOTE — Patient Instructions (Signed)
Edema °Edema is an abnormal buildup of fluids. It is more common in your legs and thighs. Painless swelling of the feet and ankles is more likely as a person ages. It also is common in looser skin, like around your eyes. °HOME CARE  °· Keep the affected body part above the level of the heart while lying down. °· Do not sit still or stand for a long time. °· Do not put anything right under your knees when you lie down. °· Do not wear tight clothes on your upper legs. °· Exercise your legs to help the puffiness (swelling) go down. °· Wear elastic bandages or support stockings as told by your doctor. °· A low-salt diet may help lessen the puffiness. °· Only take medicine as told by your doctor. °GET HELP IF: °· Treatment is not working. °· You have heart, liver, or kidney disease and notice that your skin looks puffy or shiny. °· You have puffiness in your legs that does not get better when you raise your legs. °· You have sudden weight gain for no reason. °GET HELP RIGHT AWAY IF:  °· You have shortness of breath or chest pain. °· You cannot breathe when you lie down. °· You have pain, redness, or warmth in the areas that are puffy. °· You have heart, liver, or kidney disease and get edema all of a sudden. °· You have a fever and your symptoms get worse all of a sudden. °MAKE SURE YOU:  °· Understand these instructions. °· Will watch your condition. °· Will get help right away if you are not doing well or get worse. °  °This information is not intended to replace advice given to you by your health care provider. Make sure you discuss any questions you have with your health care provider. °  °Document Released: 01/04/2008 Document Revised: 07/23/2013 Document Reviewed: 05/10/2013 °Elsevier Interactive Patient Education ©2016 Elsevier Inc. ° °

## 2015-11-20 NOTE — Progress Notes (Addendum)
50 y.o. Divorced Caucasian female G1P0010 here for follow up of perimenopausal with Micronor use for sporadic amenorrhea last period was  on 07/08/15. 2 weeks from Provera challenge and had one day period 07/26/15 after starting on Micronor. Patient has not problems with Micronor use and PMS symptoms were better until the last month has noted fluid retention and has 10 pound weight gain since 05/25/16 visit. She feels her hands/feet stay swollen. She is exercising 6 days a week and eating healthy diet, but no weight loss. Concerned may be from Micronor use or other issues. Denies any headaches or pain. Patient does eat prepared foods and canned foods frequently. No other changes. Denies any urinary issues, drinking water as normal and voiding WNL for her. Sexually active. 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 No other health issues today.  LMP 07/25/16  O: Healthy WD,WN female Affect: Normal, orientation x 3  Skin:warm and dry Heart: NSSR no murmurs Lungs clear Extremities: hands very slight edema noted, feet slight edema, no pitting on feet or legs. Pulses equal bilateral and strong. Abdomen:non tender, no masses, LSK no masses or enlargement Pelvic exam:EXTERNAL GENITALIA: normal appearing vulva with no masses, tenderness or lesions VAGINA: no abnormal discharge or lesions and normal CERVIX: no lesions or cervical motion tenderness UTERUS: normal,non tender,no masses ADNEXA: no masses palpable and nontender  Urine: negative  Ph 5.0  A:Fluid retention of extremities, no pitting edema ? Micronor related Perimenopausal ?salt in diet Normal pelvic exam  P: Discussed fluid retention could be related to progesterone use and can stop to see if resolves, since no bleeding in 4 months. Also can related to sitting for long periods of time or salt in diet. Discussed reducing sodium consumption in diet, breaks from sitting at desk with frequent water intake. Increase protein in diet to help  with fluid exchange. Discussed warning signs of increase fluid, but she does not any pitting edema, so minimal change. Discussed labs to evaluate, agreeable. Will await labs and then decide if will discontinue Micronor. Questions addressed.  Labs: CMP,Hgb A1-C, FSH, TSH  Rv prn

## 2015-11-20 NOTE — Telephone Encounter (Signed)
Call to patient. She states that she has noticed worsening swelling in bilateral hands and feet and feels it is related to progesterone only birth control pills that she is taking. She has been working out up to 5 days per week and has been motivated with losing weight and working out at gym and walking every day, now feels increased fatigue and feels she is unable to complete her workouts like she has been. Feels like her stamina is weakened. No SOB, no chest pain. She states that her bra has become tight and having bloating in abdomen as well.  Office visit recommended for evaluation, advised either PCP or with Leota Sauerseborah Leonard CNM, patient advised she may be referred back to PCP. Patient requests office visit with Leota Sauerseborah Leonard CNM  and is intermittently tearful.  Office visit for this morning at 1000 with Leota Sauerseborah Leonard CNM scheduled.

## 2015-11-20 NOTE — Telephone Encounter (Signed)
Patient is having "fluid issues with her feet and hands" and would like to talk with a nurse.

## 2015-11-21 LAB — HEMOGLOBIN A1C
Hgb A1c MFr Bld: 5.4 % (ref <5.7)
Mean Plasma Glucose: 108 mg/dL

## 2015-11-21 LAB — VITAMIN D 25 HYDROXY (VIT D DEFICIENCY, FRACTURES): VIT D 25 HYDROXY: 45 ng/mL (ref 30–100)

## 2015-11-24 ENCOUNTER — Other Ambulatory Visit: Payer: Self-pay | Admitting: Certified Nurse Midwife

## 2015-11-24 DIAGNOSIS — R899 Unspecified abnormal finding in specimens from other organs, systems and tissues: Secondary | ICD-10-CM

## 2015-11-26 NOTE — Progress Notes (Signed)
Encounter reviewed Syna Gad, MD   

## 2015-11-29 ENCOUNTER — Other Ambulatory Visit: Payer: Self-pay | Admitting: Certified Nurse Midwife

## 2015-11-30 NOTE — Telephone Encounter (Signed)
Medication refill request: Camila 0.35 mg  Last AEX:  01/13/15 with DL  Next AEX: 9/81/19146/22/2017 with DL  Last MMG (if hormonal medication request): 11/25/15 BI-RADS 1: NEG Refill authorized: #3 packs  Routed to Ms. Patty since Ms. Eunice BlaseDebbie is out of the office today.

## 2015-12-09 ENCOUNTER — Other Ambulatory Visit (INDEPENDENT_AMBULATORY_CARE_PROVIDER_SITE_OTHER): Payer: BC Managed Care – PPO

## 2015-12-09 DIAGNOSIS — R899 Unspecified abnormal finding in specimens from other organs, systems and tissues: Secondary | ICD-10-CM

## 2015-12-09 LAB — HEPATIC FUNCTION PANEL
ALK PHOS: 54 U/L (ref 33–130)
ALT: 19 U/L (ref 6–29)
AST: 23 U/L (ref 10–35)
Albumin: 4.3 g/dL (ref 3.6–5.1)
BILIRUBIN INDIRECT: 1.1 mg/dL (ref 0.2–1.2)
BILIRUBIN TOTAL: 1.3 mg/dL — AB (ref 0.2–1.2)
Bilirubin, Direct: 0.2 mg/dL (ref <=0.2)
Total Protein: 6.8 g/dL (ref 6.1–8.1)

## 2015-12-10 LAB — HEPATITIS C ANTIBODY: HCV Ab: NEGATIVE

## 2015-12-11 ENCOUNTER — Telehealth: Payer: Self-pay | Admitting: Certified Nurse Midwife

## 2015-12-11 NOTE — Telephone Encounter (Signed)
Routing to PepsiCoDeborah Leonard CNM for review and advise of labs from 12/09/2015.

## 2015-12-11 NOTE — Telephone Encounter (Signed)
Spoke with patient. Advised patient of message as seen below from PepsiCoDeborah Leonard CNM. She is agreeable and verbalizes understanding. Reports she is still taking Micronor and is not having any vaginal bleeding. States she is still experiencing "fluid retention" in her hands and feet that she is worried about. Asking if there is another progesterone only pill she can try to see if the Micronor is causing fluid retention. Advised I will speak with Leota Sauerseborah Leonard CNM and return call with further recommendations. She is agreeable. Reports she has lost 4 pounds since her last visit on 11/20/2015 with diet and exercise.  Notes Recorded by Verner Choleborah S Leonard, CNM on 12/11/2015 at 4:43 PM Notify patient that no change in bilrubin and not a concern with hepatic function being normal. Recommend recheck with aex. Hep. C is negative.  Currently reviewing her diet. Has she had any vaginal bleeding with Micronor. Is she still taking ?

## 2015-12-11 NOTE — Telephone Encounter (Signed)
Just put note to Joy regarding results.

## 2015-12-11 NOTE — Telephone Encounter (Signed)
Patient calling for recent lab results. She hopes to get them before the weekend, if possible.

## 2015-12-11 NOTE — Telephone Encounter (Signed)
See lab result for message.

## 2015-12-16 NOTE — Telephone Encounter (Signed)
Patient is asking if she could take a medication to help reduce swelling.

## 2015-12-16 NOTE — Telephone Encounter (Signed)
I have reviewed her diet and do not see any major concerns. I think she should stop her Micronor, she has not had any bleeding since 12/16. Her FSH was perimenopausal level. She may have bleeding with stopping,but due to concern with edema due to use, I would like her to be off and advise if edema is resolving in the next few days. Other than edema any other symptoms?

## 2015-12-16 NOTE — Telephone Encounter (Signed)
It may take a week before any change, but calling after weekend acceptable.

## 2015-12-16 NOTE — Telephone Encounter (Signed)
Spoke with patient. Advised of message as seen below from PepsiCoDeborah Leonard CNM. She is agreeable and verbalizes understanding. Reports she has enough Micronor to take until Saturday. Would like to take pills until Saturday and then come off so that she is not off of the prescription over the weekend if she needs anything. Aware she will need to be off of the pills for 2 days and return call with an update on how she is doing. Patient denies any other symptoms such as SOB or irregular heart beat.

## 2015-12-16 NOTE — Telephone Encounter (Signed)
Spoke with patient. Patient states that her swelling has not decreased. Reports it is not worsening, but is not getting better. States swelling is in her hands, feet, and abdomen. "I feel like it is all over." Patient is due to refill her rx for Micronor today, but would like to know if she needs to start on a new medication to see if that will reduce her swelling. Also asking if she can be started on a diuretic to reduce swelling. Advised our office does not typically prescribe diuretic medications. Patient reports she does not see her PCP frequently and has been discussing this with Leota Sauerseborah Leonard CNM because she knows her history and feels this started around the time she started her POP. Advised I will speak with Leota Sauerseborah Leonard CNM and return call with further recommendations. She is agreeable.

## 2015-12-21 ENCOUNTER — Telehealth: Payer: Self-pay | Admitting: Certified Nurse Midwife

## 2015-12-21 NOTE — Telephone Encounter (Signed)
Attempted to reach patient at number provided [phone]. There was no answer and recording states the voicemail box has not been set up yet.

## 2015-12-21 NOTE — Telephone Encounter (Signed)
Spoke with patient. Patient states that she stopped her Micronor on Sunday 12/20/2015. Reports she had mild cramping this morning, but this has stopped. Denies having any bleeding. Reports she feels her swelling has gone down in her feet, but still has slight swelling in her hands. Denies any other symptoms. She has scheduled an appointment with her PCP on 12/24/2015. Advised she will need to stay off of her Micronor until she is seen with her PCP on 12/24/2015. Advised if she develops any symptoms such as bleeding prior to her appointment she will need to contact our office. She is agreeable. Advised I will provide Leota Sauerseborah Leonard CNM with an update on how she is doing and return call with any further recommendations. She is agreeable.

## 2015-12-21 NOTE — Telephone Encounter (Signed)
Patient is returning a call to Kaitlyn. °

## 2015-12-21 NOTE — Telephone Encounter (Signed)
Patient is calling to give information to Mertha Findersebbie Leonard's nurse after finishing her medication.

## 2015-12-22 NOTE — Telephone Encounter (Signed)
She needs to let her PCP know about the edema and any other concerns regarding this. I want her to stay off her Micronor.

## 2015-12-22 NOTE — Telephone Encounter (Signed)
Patient has been notified. Will close encounter. 

## 2015-12-23 ENCOUNTER — Other Ambulatory Visit: Payer: Self-pay | Admitting: Certified Nurse Midwife

## 2015-12-23 NOTE — Telephone Encounter (Signed)
Routing to PepsiCoDeborah Leonard CNM to review and advise. I have checked patient chart and do not see any documents that have been scanned.

## 2015-12-23 NOTE — Telephone Encounter (Signed)
Patient has an appointment with her general practitioner and would like to have the log of information she [facility] as well as when she had to start taking the progesterone.

## 2015-12-24 NOTE — Telephone Encounter (Signed)
Patient is requesting a prescription for a diaphragm. [facility].

## 2015-12-24 NOTE — Telephone Encounter (Signed)
Attempted to call patient to let her know the diet documents are ready for her to pick up but her voice mail is not set up. Placing in patient pickup drawer.

## 2015-12-24 NOTE — Telephone Encounter (Signed)
This patient is requesting diaphragm for contraception due to going of Micronor. Her diaphragm size is not in Epic. She will need call regarding size and may need a refitting. I am not sure if we ordered( think we did) due to pharmacy not carrying.  Thanks for the help.  Debbi

## 2015-12-24 NOTE — Telephone Encounter (Signed)
Patient is asking for a refill of her diaphragm. Confirmed pharmacy with patent.

## 2015-12-24 NOTE — Telephone Encounter (Signed)
I put diet on your desk for [name]

## 2015-12-25 NOTE — Telephone Encounter (Signed)
Call to patient. She states she has thrown away previous diaphragm because she was told she was menopausal. Now she is having to restart need for contraception. Patient states she was given prescription diaphragm last year by our office and [facility]. Requests I [facility] with [facility] for [facility] and call in a new one. Advised I can check that but also questioned if she has had any weight loss or gain since initial fitting. Patient reports 10 pound weight gain since October.  Advised office visit recommended to refit to be sure she has to correct size for effectiveness. Patient "frustrated" but appointment scheduled for 12-31-15 with Lovett Soxebbi Leonard, CNM.  Encounter closed.

## 2015-12-31 ENCOUNTER — Ambulatory Visit (INDEPENDENT_AMBULATORY_CARE_PROVIDER_SITE_OTHER): Payer: BC Managed Care – PPO | Admitting: Certified Nurse Midwife

## 2015-12-31 ENCOUNTER — Encounter: Payer: Self-pay | Admitting: Certified Nurse Midwife

## 2015-12-31 VITALS — BP 100/70 | HR 72 | Resp 16 | Ht 67.0 in | Wt 192.0 lb

## 2015-12-31 DIAGNOSIS — Z308 Encounter for other contraceptive management: Secondary | ICD-10-CM

## 2015-12-31 DIAGNOSIS — Z309 Encounter for contraceptive management, unspecified: Secondary | ICD-10-CM

## 2015-12-31 NOTE — Progress Notes (Signed)
50 y.o. Single Caucasian female G1P0010 here for diaphragm fitting. Has used diaphragm before, just needs new one and needs new fitting. Patient has stopped Micronor due to fluid retention, which has decreased. Has been off medication two weeks. Recently seen by new PCP Dr. Velna Hatchetobin Sanders. She is now on [place] milk for decrease edema use, which is working. She has appointment with nutritionist also. Exercising daily. No period since 12/16, keeping menses calendar and aware to advise if bleeding occurs.   O: Healthy WD,WN female Affect: normal, orientation x 3 Abdomen:soft, non tender Pelvic exam:EXTERNAL GENITALIA: normal appearing vulva with no masses, tenderness or lesions VAGINA: no abnormal discharge or lesions CERVIX: no lesions or cervical motion tenderness and normal appearance UTERUS: normal size, non tender, no masses, mid position ADNEXA: no masses palpable and nontender  A:Diaphragm contraception desired New fitting and diaphragm, previous use  Normal Pelvic exam Perimenopausal with recent stopping of Micronor due to fluid retention    P: Discussed findings of normal pelvic exam and no contraindication to diaphragm use. Trial of Ortho all flex # 70 and # 65 with # 65 being the best fit.  Diaphragm inserted by patient and removed by provider. Patient inserts for sexual activity and partner removes, her choice. Reviewed instructions for use and cleaning of diaphragm. Warning signs with use given. Discussed importance of spermicidal jelly or cream use with diaphragm as before. Questions addressed. Diaphragm will be order and patient called when comes in, to pick up. Patient due aex end of June and will recheck at that time.  Rv prn, as above

## 2015-12-31 NOTE — Patient Instructions (Signed)
Diaphragm Information and Use A diaphragmis a soft, latex or silicone dome-shaped barrier that is placed in the vagina with spermicidal jelly before sexual intercourse. It covers the cervix, kills sperm, and blocks the passage of sperm into the cervix. This method does not protect against sexually transmitted diseases (STDs). A diaphragm must be fitted by a health care provider during a pelvic exam. A health care provider will measure your vagina prior to prescribing a diaphragm. You will also learn about use, care, and problems of a diaphragm during the exam. If you have significant weight changes (gain or lose 20% of your body weight) or become pregnant, it is important to have the diaphragm rechecked and possibly refitted. The diaphragm should be replaced every 2 years, or sooner if damaged.  The diaphragm can be inserted up to 2 hours before sex. If it is inserted more than 2 hours before intercourse, then the spermicide must be reapplied.  ADVANTAGES  You can use it while breastfeeding.  It is not felt by your sex partner.  It does not interfere with your female hormones.  It works immediately and is not permanent. DISADVANTAGES  It is sometimes difficult to insert.  It may shift out of place during sexual intercourse.  You must have it fitted and refitted by your health care provider.  It may increase the risk of urinary tract infections.  There is an increased risk of toxic shock syndrome if it is left in place for more than 24 hours. HOW TO INSERT A DIAPHRAGM 1. Check the diaphragm for holes by holding it up to the light, stretching the latex, or by filling it with water. 2. Place the spermicide cream or jelly inside the dome and around the rim of the diaphragm. 3. Squeeze the rim of the diaphragm and insert the diaphragm into the vagina. 4. The opening of the dome should face the cervix while inserting the diaphragm. 5. The front part (or top) of the rim should be behind the  pubic bone and pushed over the top of the cervix. 6. Be sure the cervix is completely covered by reaching into your vagina and feeling the cervix behind the latex dome of the diaphragm. If you are uncomfortable, it is not inserted properly. Try inserting it again. 7. Leave the diaphragm in for 6 to 8 hours after intercourse. If intercourse occurs again within these 6 hours, another application of spermicide is needed. 8. The diaphragm should not be left in place for longer than 24 hours. HOME CARE INSTRUCTIONS   Wash the diaphragm with mild soap and warm water. Rinse it thoroughly and dry it completely after every use.  Only use water-based lubricants with the diaphragm. Oil-based lubricants can damage the diaphragm.  Do not use talc on the diaphragm.  Do not use the diaphragm if:  You had a baby in the last 2 months.  You have a vaginal infection.  You are having a menstrual period.  You had recent surgery on your cervix or vagina.  You have vaginal bleeding of unknown cause.  Your sex partner is allergic to latex or spermicides. SEEK MEDICAL CARE IF:   You have pain during sexual intercourse when using the diaphragm.  The diaphragm slips out of place during sexual intercourse.  You have blood in your urine.  You have burning or pain when you urinate.  You find a hole in the diaphragm.  You develop abnormal vaginal discharge.  You have vaginal itching or irritation.  You cannot  during sexual intercourse.  · You have blood in your urine.  · You have burning or pain when you urinate.  · You find a hole in the diaphragm.  · You develop abnormal vaginal discharge.  · You have vaginal itching or irritation.  · You cannot remove the diaphragm.  · You think you may be pregnant.  · You need to be refitted for a diaphragm.     This information is not intended to replace advice given to you by your health care provider. Make sure you discuss any questions you have with your health care provider.     Document Released: 10/08/2002 Document Revised: 07/23/2013 Document Reviewed: 01/07/2013  Elsevier Interactive Patient Education ©2016 Elsevier [employer]

## 2016-01-01 ENCOUNTER — Ambulatory Visit: Payer: BC Managed Care – PPO | Admitting: Certified Nurse Midwife

## 2016-01-01 NOTE — Progress Notes (Signed)
Reviewed personally.  M. Suzanne Liahna Brickner, MD.  

## 2016-01-07 ENCOUNTER — Telehealth: Payer: Self-pay | Admitting: *Deleted

## 2016-01-07 NOTE — Telephone Encounter (Signed)
Call to patient. Advised diaphragm has arrived and may be picked up. Patient states she will come to office for tomorrow.   Omniflex Size 65 Diaphragm left at desk for patient.  Routing to provider for final review. Patient agreeable to disposition. Will close encounter.

## 2016-01-21 ENCOUNTER — Ambulatory Visit: Payer: BC Managed Care – PPO | Admitting: Certified Nurse Midwife

## 2016-01-24 ENCOUNTER — Emergency Department
Admission: EM | Admit: 2016-01-24 | Discharge: 2016-01-24 | Disposition: A | Discharge status: Home / Self Care | Payer: BC Managed Care – PPO | Attending: Student | Admitting: Student

## 2016-01-24 ENCOUNTER — Encounter: Payer: Self-pay | Admitting: Emergency Medicine

## 2016-01-24 DIAGNOSIS — Y939 Activity, unspecified: Secondary | ICD-10-CM | POA: Diagnosis not present

## 2016-01-24 DIAGNOSIS — Y929 Unspecified place or not applicable: Secondary | ICD-10-CM | POA: Insufficient documentation

## 2016-01-24 DIAGNOSIS — T192XXA Foreign body in vulva and vagina, initial encounter: Secondary | ICD-10-CM

## 2016-01-24 DIAGNOSIS — Y999 Unspecified external cause status: Secondary | ICD-10-CM | POA: Diagnosis not present

## 2016-01-24 DIAGNOSIS — X58XXXA Exposure to other specified factors, initial encounter: Secondary | ICD-10-CM | POA: Insufficient documentation

## 2016-01-24 DIAGNOSIS — Z86718 Personal history of other venous thrombosis and embolism: Secondary | ICD-10-CM | POA: Diagnosis not present

## 2016-01-24 NOTE — Discharge Instructions (Signed)
Vaginal Foreign Body °A vaginal foreign body is any object that gets stuck or left inside the vagina. Vaginal foreign bodies left in the vagina for a long time can cause irritation and infection. In most cases, symptoms go away once the vaginal foreign body is found and removed. Rarely, a foreign object can break through the walls of the vagina and cause a serious infection inside the abdomen.  °CAUSES  °The most common vaginal foreign bodies are: °· Tampons. °· Contraceptive devices. °· Toilet tissue left in the vagina. °· Small objects that were placed in the vagina out of curiosity and got stuck. °· A result of sexual abuse.  °SIGNS AND SYMPTOMS °· Light vaginal bleeding. °· Blood-tinged vaginal fluid (discharge). °· Vaginal discharge that smells bad. °· Vaginal itching or burning. °· Redness, swelling, or rash near the opening of the vagina. °· Abdominal pain. °· Fever. °· Burning or frequent urination. °DIAGNOSIS  °Your health care provider may be able to diagnose a vaginal foreign body based on the information you provide, your symptoms, and a physical exam. Your health care provider may also perform the following tests to check for infection: °· A swab of the discharge to check under a microscope for bacteria (culture). °· A urine culture. °· An examination of the vagina with a small, lighted scope (vaginoscopy). °· Imaging tests to get a picture of the inside of your vagina, such as: °¨ Ultrasound. °¨ X-ray. °¨ MRI. °TREATMENT  °In most cases, a vaginal foreign body can be easily removed and the symptoms usually go away very quickly. Other treatment may include:  °· If the vaginal foreign body is not easily removed, medicine may be given to make you go to sleep (general anesthesia) to have the object removed. °· Emergency surgery may be necessary if an infection spreads through the walls of the vagina into the abdomen (acute abdomen). This is rare. °· You may need to take antibiotic medicine if you have a  vaginal or urinary tract infection. °HOME CARE INSTRUCTIONS  °· Take medicines only as directed by your health care provider. °· If you were prescribed an antibiotic medicine, finish it all even if you start to feel better. °· Do not have sex or use tampons until your health care provider says it is okay. °· Do not douche or use vaginal rinses unless your health care provider recommends it. °· Keep all follow-up visits as directed by your health care provider. This is important. °SEEK MEDICAL CARE IF: °· You have abdominal pain or burning pain when urinating. °· You have a fever. °SEEK IMMEDIATE MEDICAL CARE IF: °· You have heavy vaginal bleeding or discharge.   °· You have very bad abdominal pain.   °MAKE SURE YOU: °· Understand these instructions. °· Will watch your condition. °· Will get help right away if you are not doing well or get worse. °  °This information is not intended to replace advice given to you by your health care provider. Make sure you discuss any questions you have with your health care provider. °  °Document Released: 12/02/2013 Document Reviewed: 12/02/2013 °Elsevier Interactive Patient Education ©2016 Elsevier Inc. ° °

## 2016-01-24 NOTE — ED Provider Notes (Signed)
[facility] Emergency Department Provider Note  ____________________________________________  Time seen: Approximately 9:40 PM  I have reviewed the triage vital signs and the nursing notes.   HISTORY  Chief Complaint Foreign Body in Vagina    HPI Brenda Mosley is a 50 y.o. female who presents to the emergency department for assistance removing her diaphragm. She inserted it the morning, but she nor her husband has been able to remove it. She states this is a new device and has some type of flap on the outer edge that is preventing it from "breaking suction." She denies pain or vaginal bleeding.  Past Medical History  Diagnosis Date  . DVT (deep venous thrombosis) (HCC)     rt calf treatment 8months due to oc's    There are no active problems to display for this patient.   Past Surgical History  Procedure Laterality Date  . Breast enhancement surgery Bilateral 8/09    saline  . Laparoscopy      Current Outpatient Rx  Name  Route  Sig  Dispense  Refill  . B Complex Vitamins (B COMPLEX PO)   Oral   Take by mouth.         . Vitamin D, Cholecalciferol, 1000 UNITS TABS   Oral   Take 2 tablets by mouth daily.            Allergies Tylenol with codeine #3  Family History  Problem Relation Age of Onset  . Heart disease Mother   . Hodgkin's lymphoma Father     Social History Social History  Substance Use Topics  . Smoking status: Never Smoker   . Smokeless tobacco: Never Used  . Alcohol Use: No    Review of Systems Constitutional: Negative for fever. Respiratory: Negative for shortness of breath or cough. Gastrointestinal: Negative for abdominal pain; negeative for nausea , negative for vomiting. Genitourinary: Negative for dysuria , negative for vaginal discharge. Positive for foreign body. Musculoskeletal: Negative for back pain. Skin: Negative for wound, lesion, rash. ____________________________________________   PHYSICAL  EXAM:  VITAL SIGNS: ED Triage Vitals  Enc Vitals Group     BP 01/24/16 1637 147/91 mmHg     Pulse Rate 01/24/16 1637 79     Resp 01/24/16 1637 18     Temp 01/24/16 1637 98.6 F (37 C)     Temp Source 01/24/16 1637 Oral     SpO2 01/24/16 1637 100 %     Weight 01/24/16 1637 190 lb (86.183 kg)     Height 01/24/16 1637 5\' 7"  (1.702 m)     Head Cir --      Peak Flow --      Pain Score --      Pain Loc --      Pain Edu? --      Excl. in GC? --     Constitutional: Alert and oriented. Well appearing and in no acute distress. Eyes: Conjunctivae are normal. PERRL. EOMI. Head: Atraumatic. Nose: No congestion/rhinnorhea. Mouth/Throat: Mucous membranes are moist. Respiratory: Normal respiratory effort.  No retractions. Gastrointestinal: Soft, nontender. Genitourinary: Pelvic exam: Diaphragm in place over cervix. Musculoskeletal: No extremity tenderness nor edema.  Neurologic:  Normal speech and language. No gross focal neurologic deficits are appreciated. Speech is normal. No gait instability. Skin:  Skin is warm, dry and intact. No rash noted. Psychiatric: Mood and affect are normal. Speech and behavior are normal.  ____________________________________________   LABS (all labs ordered are listed, but only abnormal results are  displayed)  Labs Reviewed - No data to display ____________________________________________  RADIOLOGY  Not indicated. ____________________________________________   PROCEDURES  Procedure(s) performed:   Foreign Body Removal:  Procedure explained and permission received from patient. Body Part: vagina Anesthesia:none Supplies:ring forcep Technique:n/a Procedure was successful Type of foreign body removed: diaphragm  Patient tolerated the procedure well with no immediate complications.   ____________________________________________   INITIAL IMPRESSION / ASSESSMENT AND PLAN / ED COURSE  Pertinent labs & imaging results that were  available during my care of the patient were reviewed by me and considered in my medical decision making (see chart for details).  She was advised to follow up with GYN. She was advised to return to the ER for symptoms that change or worsen if unable to schedule an appointment. ____________________________________________   FINAL CLINICAL IMPRESSION(S) / ED DIAGNOSES  Final diagnoses:  Foreign body in vagina, initial encounter    Note:  This document was prepared using Dragon voice recognition software and may include unintentional dictation errors.   Chinita PesterCari B Zaylon Bossier, FNP 01/24/16 2149  Gayla DossEryka A Gayle, MD 01/24/16 2258

## 2016-01-24 NOTE — ED Notes (Signed)
Patient to ER for c/o diaphragm stuck in vagina.

## 2016-01-24 NOTE — ED Notes (Signed)
NP Loralyn Freshwaterari Hazelgrace at bedside.

## 2016-01-24 NOTE — ED Notes (Signed)
Pt verbalized understanding of the discharge instructions. NAD at this time.

## 2017-03-09 ENCOUNTER — Encounter (HOSPITAL_COMMUNITY): Payer: Self-pay | Admitting: Obstetrics and Gynecology

## 2017-03-17 ENCOUNTER — Ambulatory Visit (INDEPENDENT_AMBULATORY_CARE_PROVIDER_SITE_OTHER): Payer: BC Managed Care – PPO | Admitting: Certified Nurse Midwife

## 2017-03-17 VITALS — BP 126/79 | HR 71 | Ht 67.0 in | Wt 168.1 lb

## 2017-03-17 DIAGNOSIS — Z8742 Personal history of other diseases of the female genital tract: Secondary | ICD-10-CM

## 2017-03-17 DIAGNOSIS — Z87898 Personal history of other specified conditions: Secondary | ICD-10-CM

## 2017-03-17 NOTE — Patient Instructions (Signed)
Preventive Care 40-64 Years, Female Preventive care refers to lifestyle choices and visits with your health care provider that can promote health and wellness. What does preventive care include?  A yearly physical exam. This is also called an annual well check.  Dental exams once or twice a year.  Routine eye exams. Ask your health care provider how often you should have your eyes checked.  Personal lifestyle choices, including: ? Daily care of your teeth and gums. ? Regular physical activity. ? Eating a healthy diet. ? Avoiding tobacco and drug use. ? Limiting alcohol use. ? Practicing safe sex. ? Taking low-dose aspirin daily starting at age 58. ? Taking vitamin and mineral supplements as recommended by your health care provider. What happens during an annual well check? The services and screenings done by your health care provider during your annual well check will depend on your age, overall health, lifestyle risk factors, and family history of disease. Counseling Your health care provider may ask you questions about your:  Alcohol use.  Tobacco use.  Drug use.  Emotional well-being.  Home and relationship well-being.  Sexual activity.  Eating habits.  Work and work Statistician.  Method of birth control.  Menstrual cycle.  Pregnancy history.  Screening You may have the following tests or measurements:  Height, weight, and BMI.  Blood pressure.  Lipid and cholesterol levels. These may be checked every 5 years, or more frequently if you are over 81 years old.  Skin check.  Lung cancer screening. You may have this screening every year starting at age 78 if you have a 30-pack-year history of smoking and currently smoke or have quit within the past 15 years.  Fecal occult blood test (FOBT) of the stool. You may have this test every year starting at age 65.  Flexible sigmoidoscopy or colonoscopy. You may have a sigmoidoscopy every 5 years or a colonoscopy  every 10 years starting at age 30.  Hepatitis C blood test.  Hepatitis B blood test.  Sexually transmitted disease (STD) testing.  Diabetes screening. This is done by checking your blood sugar (glucose) after you have not eaten for a while (fasting). You may have this done every 1-3 years.  Mammogram. This may be done every 1-2 years. Talk to your health care provider about when you should start having regular mammograms. This may depend on whether you have a family history of breast cancer.  BRCA-related cancer screening. This may be done if you have a family history of breast, ovarian, tubal, or peritoneal cancers.  Pelvic exam and Pap test. This may be done every 3 years starting at age 80. Starting at age 36, this may be done every 5 years if you have a Pap test in combination with an HPV test.  Bone density scan. This is done to screen for osteoporosis. You may have this scan if you are at high risk for osteoporosis.  Discuss your test results, treatment options, and if necessary, the need for more tests with your health care provider. Vaccines Your health care provider may recommend certain vaccines, such as:  Influenza vaccine. This is recommended every year.  Tetanus, diphtheria, and acellular pertussis (Tdap, Td) vaccine. You may need a Td booster every 10 years.  Varicella vaccine. You may need this if you have not been vaccinated.  Zoster vaccine. You may need this after age 5.  Measles, mumps, and rubella (MMR) vaccine. You may need at least one dose of MMR if you were born in  1957 or later. You may also need a second dose.  Pneumococcal 13-valent conjugate (PCV13) vaccine. You may need this if you have certain conditions and were not previously vaccinated.  Pneumococcal polysaccharide (PPSV23) vaccine. You may need one or two doses if you smoke cigarettes or if you have certain conditions.  Meningococcal vaccine. You may need this if you have certain  conditions.  Hepatitis A vaccine. You may need this if you have certain conditions or if you travel or work in places where you may be exposed to hepatitis A.  Hepatitis B vaccine. You may need this if you have certain conditions or if you travel or work in places where you may be exposed to hepatitis B.  Haemophilus influenzae type b (Hib) vaccine. You may need this if you have certain conditions.  Talk to your health care provider about which screenings and vaccines you need and how often you need them. This information is not intended to replace advice given to you by your health care provider. Make sure you discuss any questions you have with your health care provider. Document Released: 08/14/2015 Document Revised: 04/06/2016 Document Reviewed: 05/19/2015 Elsevier Interactive Patient Education  2017 [employer].

## 2017-03-17 NOTE — Progress Notes (Signed)
GYN ENCOUNTER NOTE  Subjective:       Brenda Mosley is a 51 y.o. G78P0010 female is here for gynecologic evaluation of the following issues:  1. She had a abnormal pap smear and is requesting a second opinion.  She states that at her last exam with previous provider she was unsure of the technique used to collect the pap smear. The provider had an accent making it difficult for her to understand. She also complains of excessive hair loss ( she has 2 plastic bags with small sandwhich bags with hair she has collected that are dated and noted on which days she washed her hair).    Gynecologic History No LMP recorded. Patient is not currently having periods (Reason: Perimenopausal). Contraception: none Last Pap: 2018. Results were: abnormal. She can not remember what the results were. No records in epic. Requesting release of medical records.  Last mammogram: 2018. Results were: normal per pt.   Obstetric History OB History  Gravida Para Term Preterm AB Living  1       1 0  SAB TAB Ectopic Multiple Live Births  1            # Outcome Date GA Lbr Len/2nd Weight Sex Delivery Anes PTL Lv  1 SAB 1993              Past Medical History:  Diagnosis Date  . DVT (deep venous thrombosis) (HCC)    rt calf treatment 8months due to oc's    Past Surgical History:  Procedure Laterality Date  . BREAST ENHANCEMENT SURGERY Bilateral 8/09   saline  . LAPAROSCOPY      Current Outpatient Prescriptions on File Prior to Visit  Medication Sig Dispense Refill  . Vitamin D, Cholecalciferol, 1000 UNITS TABS Take 2 tablets by mouth daily.     . B Complex Vitamins (B COMPLEX PO) Take by mouth.     No current facility-administered medications on file prior to visit.     Allergies  Allergen Reactions  . Epinephrine Other (See Comments)  . Tylenol With Codeine #3 [Acetaminophen-Codeine]     Makes her hyper    Social History   Social History  . Marital status: Divorced    Spouse name: N/A  .  Number of children: N/A  . Years of education: N/A   Occupational History  . Not on file.   Social History Main Topics  . Smoking status: Never Smoker  . Smokeless tobacco: Never Used  . Alcohol use No  . Drug use: No  . Sexual activity: Yes    Partners: Male    Birth control/ protection: Condom   Other Topics Concern  . Not on file   Social History Narrative  . No narrative on file    Family History  Problem Relation Age of Onset  . Heart disease Mother   . Hodgkin's lymphoma Father     The following portions of the patient's history were reviewed and updated as appropriate: allergies, current medications, past family history, past medical history, past social history, past surgical history and problem list.  Review of Systems Review of Systems - Negative except as mentioned in HPI Review of Systems - General ROS: negative for - chills, fatigue, fever, hot flashes, malaise or night sweats Hematological and Lymphatic ROS: negative for - bleeding problems or swollen lymph nodes Gastrointestinal ROS: negative for - abdominal pain, blood in stools, change in bowel habits and nausea/vomiting Musculoskeletal ROS: negative for - joint pain, muscle  pain or muscular weakness Genito-Urinary ROS: negative for - change in menstrual cycle, dysmenorrhea, dyspareunia, dysuria, genital discharge, genital ulcers, hematuria, incontinence, irregular/heavy menses, nocturia or pelvic pain  Objective:   BP 126/79   Pulse 71   Ht 5\' 7"  (1.702 m)   Wt 168 lb 1 oz (76.2 kg)   BMI 26.32 kg/m  CONSTITUTIONAL: Well-developed, well-nourished female in no acute distress.  HENT:  Normocephalic, atraumatic.  NECK: Normal range of motion, supple SKIN: Skin is warm and dry. No rash noted. Not diaphoretic. No erythema. No pallor. NEUROLGIC: Alert and oriented to person, place, and time. PSYCHIATRIC: Normal mood and affect. Normal behavior. Normal judgment and thought content. CARDIOVASCULAR:Not  Examined RESPIRATORY: Not Examined BREASTS: Not Examined ABDOMEN: Soft, non distended; Non tender.  No Organomegaly. PELVIC: not indicated MUSCULOSKELETAL: Normal range of motion. No tenderness.  No cyanosis, clubbing, or edema.    Assessment:   Hx of abnormal pap smear   Plan:   Pt last TSH was normal 11/20/2015. Requesting patient to sign release of medical information form. Once records are received will review and advise pt. Discussed cause of hair loss. She has recently changed diet to Keto diet and had 28 lbs weight loss. Reviewed possible need for colposcopy based on results. PT will be advised regarding follow up after reviewing record.   I attest that I spent more than 50% of this visit reviewing pt history, discussing abnormal pap smears and hair loss, reviewing treatment plans.   Doreene Burke, CNM

## 2017-03-17 NOTE — Progress Notes (Signed)
Pt is here for a consultation for an abnormal pap smear.Also has hair loss. Pt is also on keto diet.

## 2017-03-27 ENCOUNTER — Telehealth: Payer: Self-pay | Admitting: Certified Nurse Midwife

## 2017-03-28 ENCOUNTER — Encounter: Payer: Self-pay | Admitting: *Deleted

## 2017-03-28 NOTE — Telephone Encounter (Signed)
I received Brenda Mosley's medical records from [facility]. She had seen me 03/17/2017 for a second opinion regarding her pap smear. Her pap smear was collected on 03/02/17. 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 I called [name] and reviewed the results. Discussed follow up of colposcopy. Reviewed the procedure with her and answered all of her questions. She will schedule the procedure with Melody Shambly, CNM at her earliest convince.   Doreene Burke, CNM

## 2017-03-30 ENCOUNTER — Telehealth: Payer: Self-pay | Admitting: Certified Nurse Midwife

## 2017-03-30 NOTE — Telephone Encounter (Signed)
Pt stated that she had discussed with Pattricia BossAnnie that labs might be ordered at her next visit with Melody. Pt wanted to know if she was going to have labs done at that visit b/c she is concerned about her hair loss. Please advise. Thanks TNP

## 2017-03-31 NOTE — Telephone Encounter (Signed)
Your Thyroid stimulating hormone result that I reviewed from this year was normal. Given that you have been having this hair loss I do not believe that the hair loss is in relation to your thyroid. However, if you would like we can recheck it. More likely, the hair loss is related to postmenopausal changes. I can order Minoxidil to apply to your scalp. Treatment may result in no effect, the inhibition of further hair loss, or a variable degree of regrowth of hair. At least one year of treatment with minoxidil is required prior to drawing conclusions regarding treatment efficacy. Shedding of hair commonly occurs during the first two to eight weeks of minoxidil treatment, an event likely due to the release of telogen hairs as follicles are stimulated to transition from telogen to the anagen phase . Please let me know if you would like to proceed with the TSH lab and or the  medication.   Thank you,  Doreene BurkeAnnie Jenean Escandon, CNM

## 2017-04-04 NOTE — Telephone Encounter (Signed)
Pt informed. Will continue biotin for now. Actually this has improved. No further treatment at this time.

## 2017-04-12 ENCOUNTER — Encounter: Payer: Self-pay | Admitting: Obstetrics and Gynecology

## 2017-04-12 ENCOUNTER — Other Ambulatory Visit: Payer: Self-pay | Admitting: Obstetrics and Gynecology

## 2017-04-12 ENCOUNTER — Ambulatory Visit (INDEPENDENT_AMBULATORY_CARE_PROVIDER_SITE_OTHER): Payer: BC Managed Care – PPO | Admitting: Obstetrics and Gynecology

## 2017-04-12 VITALS — BP 140/89 | HR 69 | Ht 67.5 in | Wt 170.7 lb

## 2017-04-12 DIAGNOSIS — R87810 Cervical high risk human papillomavirus (HPV) DNA test positive: Secondary | ICD-10-CM

## 2017-04-12 DIAGNOSIS — R87610 Atypical squamous cells of undetermined significance on cytologic smear of cervix (ASC-US): Secondary | ICD-10-CM | POA: Diagnosis not present

## 2017-04-12 NOTE — Progress Notes (Signed)
ENCOMPASS COLPOSCOPY PROCEDURE NOTE  51 y.o. G1P0010 here for colposcopy for ASCUS with POSITIVE high risk HPV pap smear on 03/2017. Discussed role for HPV in cervical dysplasia, need for surveillance.  Patient given informed consent, signed copy in the chart, time out was performed.  Placed in lithotomy position. Cervix viewed with speculum and colposcope after application of acetic acid.   Colposcopy adequate? Yes  no visible lesions, no mosaicism, no punctation and no abnormal vasculature.  ECC specimen obtained. All specimens were labelled and sent to pathology. See scanned colpo sheet with detailed drawing.   Patient was given post procedure instructions.  Will follow up pathology and manage accordingly.  Routine preventative health maintenance measures emphasized.     Simran Mannis Suzan NailerN Lashelle Koy, CNM

## 2017-04-14 ENCOUNTER — Emergency Department: Payer: BC Managed Care – PPO

## 2017-04-14 ENCOUNTER — Encounter: Payer: Self-pay | Admitting: Emergency Medicine

## 2017-04-14 ENCOUNTER — Emergency Department
Admission: EM | Admit: 2017-04-14 | Discharge: 2017-04-14 | Disposition: A | Discharge status: Home / Self Care | Payer: BC Managed Care – PPO | Attending: Emergency Medicine | Admitting: Emergency Medicine

## 2017-04-14 DIAGNOSIS — Y998 Other external cause status: Secondary | ICD-10-CM | POA: Insufficient documentation

## 2017-04-14 DIAGNOSIS — Y93K1 Activity, walking an animal: Secondary | ICD-10-CM | POA: Diagnosis not present

## 2017-04-14 DIAGNOSIS — Z79899 Other long term (current) drug therapy: Secondary | ICD-10-CM | POA: Diagnosis not present

## 2017-04-14 DIAGNOSIS — Y929 Unspecified place or not applicable: Secondary | ICD-10-CM | POA: Insufficient documentation

## 2017-04-14 DIAGNOSIS — S82141A Displaced bicondylar fracture of right tibia, initial encounter for closed fracture: Secondary | ICD-10-CM

## 2017-04-14 DIAGNOSIS — S82109A Unspecified fracture of upper end of unspecified tibia, initial encounter for closed fracture: Secondary | ICD-10-CM | POA: Diagnosis not present

## 2017-04-14 DIAGNOSIS — S8991XA Unspecified injury of right lower leg, initial encounter: Secondary | ICD-10-CM | POA: Diagnosis present

## 2017-04-14 DIAGNOSIS — W010XXA Fall on same level from slipping, tripping and stumbling without subsequent striking against object, initial encounter: Secondary | ICD-10-CM | POA: Insufficient documentation

## 2017-04-14 MED ORDER — MELOXICAM 7.5 MG PO TABS: 7.5000 mg | ORAL_TABLET | Freq: Every day | ORAL | 1 refills | Status: AC

## 2017-04-14 MED ORDER — OXYCODONE HCL 5 MG/5ML PO SOLN
5.0000 mg | ORAL | Status: DC | PRN
  Administered 2017-04-14: 5 mg via ORAL
  Filled 2017-04-14: qty 5

## 2017-04-14 MED ORDER — OXYCODONE-ACETAMINOPHEN 5-325 MG/5ML PO SOLN: 5.0000 mL | Freq: Three times a day (TID) | ORAL | 0 refills | Status: AC | PRN

## 2017-04-14 NOTE — ED Provider Notes (Signed)
[facility] Emergency Department Provider Note  ____________________________________________  Time seen: Approximately 10:38 PM  I have reviewed the triage vital signs and the nursing notes.   HISTORY  Chief Complaint Knee Pain    HPI Brenda Mosley is a 51 y.o. female presents to the emergency department with 10 out of 10 right knee pain after patient tripped while walking her dog. Patient did not lose consciousness. She denies weakness, radiculopathy or changes in sensation of the right lower extremity. No alleviating measures were attempted prior to presenting to the emergency department.   Past Medical History:  Diagnosis Date  . DVT (deep venous thrombosis) (HCC)    rt calf treatment 8months due to oc's    There are no active problems to display for this patient.   Past Surgical History:  Procedure Laterality Date  . BREAST ENHANCEMENT SURGERY Bilateral 8/09   saline  . LAPAROSCOPY      Prior to Admission medications   Medication Sig Start Date End Date Taking? Authorizing Provider  amoxicillin (AMOXIL) 500 MG capsule Take 500 mg by mouth 3 (three) times daily.    [provider]  B Complex Vitamins (B COMPLEX PO) Take by mouth.    [provider]  Biotin 78295 MCG TABS Take by mouth.    [provider]  meloxicam (MOBIC) 7.5 MG tablet Take 1 tablet (7.5 mg total) by mouth daily. 04/14/17 04/21/17  Orvil Feil, PA-C  oxyCODONE-acetaminophen (ROXICET) [phone] MG/5ML solution Take 5 mLs by mouth every 8 (eight) hours as needed for severe pain. 04/14/17 04/19/17  Orvil Feil, PA-C  Vitamin D, Cholecalciferol, 1000 UNITS TABS Take 2 tablets by mouth daily.     [provider]    Allergies Epinephrine and Tylenol with codeine #3 [acetaminophen-codeine]  Family History  Problem Relation Age of Onset  . Heart disease Mother   . Hodgkin's lymphoma Father     Social History Social History  Substance Use  Topics  . Smoking status: Never Smoker  . Smokeless tobacco: Never Used  . Alcohol use No    Review of Systems  Constitutional: No fever/chills Eyes: No visual changes. No discharge ENT: No upper respiratory complaints. Cardiovascular: no chest pain. Respiratory: no cough. No SOB. Musculoskeletal: Patient has right knee pain. Skin: Negative for rash, abrasions, lacerations, ecchymosis. Neurological: Negative for headaches, focal weakness or numbness. ___________________________________________   PHYSICAL EXAM:  VITAL SIGNS: ED Triage Vitals  Enc Vitals Group     BP 04/14/17 2018 (!) 137/94     Pulse Rate 04/14/17 2018 (!) 110     Resp 04/14/17 2018 14     Temp 04/14/17 2018 (!) 100.7 F (38.2 C)     Temp Source 04/14/17 2018 Oral     SpO2 04/14/17 2018 100 %     Weight 04/14/17 2019 170 lb (77.1 kg)     Height 04/14/17 2019  (1.702 m)     Head Circumference --      Peak Flow --      Pain Score 04/14/17 2018 8     Pain Loc --      Pain Edu? --      Excl. in GC? --      Constitutional: Alert and oriented. Well appearing and in no acute distress. Eyes: Conjunctivae are normal. PERRL. EOMI. Head: Atraumatic. Cardiovascular: Normal rate, regular rhythm. Normal S1 and S2.  Good peripheral circulation. Respiratory: Normal respiratory effort without tachypnea or retractions. Lungs CTAB. Good  air entry to the bases with no decreased or absent breath sounds. Musculoskeletal: Patient's right knee is grossly effused.Patient performs limited range of motion at the right knee, likely secondary to pain.  Patient can perform full range of motion at the right ankle and right hip. Patient is able to move all 5 right toes. Palpable dorsalis pedis pulse, right. Neurologic:  Normal speech and language. No gross focal neurologic deficits are appreciated.  Skin: No rash noted.    ____________________________________________   LABS (all labs ordered are listed, but only abnormal  results are displayed)  Labs Reviewed - No data to display ____________________________________________  EKG   ____________________________________________  RADIOLOGY  Geraldo Pitter, personally viewed and evaluated these images (plain radiographs) as part of my medical decision making, as well as reviewing the written report by the radiologist.  Dg Knee Complete 4 Views Right  Result Date: 04/14/2017 CLINICAL DATA:  Dog walking injury.  Twisted knee. EXAM: RIGHT KNEE - COMPLETE 4+ VIEW COMPARISON:  None. FINDINGS: Large knee joint effusion. Lateral tibial plateau fracture is present with a small depressed fragment. Medial compartment intact. No fracture of the femur or fibula. IMPRESSION: Lateral tibial plateau fracture with small depressed fragment. Large joint effusion. Electronically Signed   By: Paulina Fusi M.D.   On: 04/14/2017 20:43    ____________________________________________    PROCEDURES  Procedure(s) performed:    Procedures    Medications  oxyCODONE (ROXICODONE) 5 MG/5ML solution 5 mg (5 mg Oral Given 04/14/17 2205)     ____________________________________________   INITIAL IMPRESSION / ASSESSMENT AND PLAN / ED COURSE  Pertinent labs & imaging results that were available during my care of the patient were reviewed by me and considered in my medical decision making (see chart for details).  Review of the [place] CSRS was performed in accordance of the [facility] prior to dispensing any controlled drugs.     Assessment and plan Tibial plateau fracture Patient presents to the emergency department with right knee pain after tripping while walking her dog. X-ray examination reveals a mildly depressed tibial plateau fracture, lateral. Patient was splinted in the emergency department and Roxicet was given for pain. Patient was discharged with Roxicet and meloxicam. She was advised to make an appointment with Dr. Ernest Pine as soon as possible. She voiced understanding  regarding this recommendation. Vital signs are reassuring prior to discharge.   ____________________________________________  FINAL CLINICAL IMPRESSION(S) / ED DIAGNOSES  Final diagnoses:  Closed fracture of right tibial plateau, initial encounter      NEW MEDICATIONS STARTED DURING THIS VISIT:  Discharge Medication List as of 04/14/2017 10:10 PM    START taking these medications   Details  meloxicam (MOBIC) 7.5 MG tablet Take 1 tablet (7.5 mg total) by mouth daily., Starting Fri 04/14/2017, Until Fri 04/21/2017, Print    oxyCODONE-acetaminophen (ROXICET) 5-325 MG/5ML solution Take 5 mLs by mouth every 8 (eight) hours as needed for severe pain., Starting Fri 04/14/2017, Until Wed 04/19/2017, Print            This chart was dictated using voice recognition software/Dragon. Despite best efforts to proofread, errors can occur which can change the meaning. Any change was purely unintentional.    Orvil Feil, PA-C 04/14/17 2246

## 2017-04-14 NOTE — ED Triage Notes (Signed)
Pt presents via POV c/o right knee pain. States was walking dog and dog was attempting fighting another dog and was jerking her, and twisted knee. Denies falling.

## 2017-04-14 NOTE — ED Notes (Signed)
Pt discharged to home.  Family member driving.  Discharge instructions reviewed.  Verbalized understanding.  No questions or concerns at this time.  Teach back verified.  Pt in NAD.  No items left in ED.   

## 2017-04-14 NOTE — ED Notes (Signed)
Pt with right knee swelling and pain post injury at noon today. Pt with cms intact below injury. Pt with pain on palpation and movement of right knee.

## 2017-04-15 MED ORDER — OXYCODONE HCL 5 MG/5ML PO SOLN: 5.0000 mg | Freq: Three times a day (TID) | ORAL | 0 refills | Status: DC | PRN

## 2017-04-19 ENCOUNTER — Ambulatory Visit
Admission: RE | Admit: 2017-04-19 | Discharge: 2017-04-19 | Disposition: A | Discharge status: Home / Self Care | Payer: BC Managed Care – PPO | Source: Ambulatory Visit | Attending: Orthopedic Surgery | Admitting: Orthopedic Surgery

## 2017-04-19 ENCOUNTER — Other Ambulatory Visit: Payer: Self-pay | Admitting: Orthopedic Surgery

## 2017-04-19 DIAGNOSIS — S82142A Displaced bicondylar fracture of left tibia, initial encounter for closed fracture: Secondary | ICD-10-CM | POA: Insufficient documentation

## 2017-04-19 DIAGNOSIS — X58XXXA Exposure to other specified factors, initial encounter: Secondary | ICD-10-CM | POA: Insufficient documentation

## 2017-04-25 ENCOUNTER — Telehealth: Payer: Self-pay | Admitting: *Deleted

## 2017-04-25 NOTE — Telephone Encounter (Signed)
Notified pt. 

## 2017-04-25 NOTE — Telephone Encounter (Signed)
-----   Message from Purcell Nails, [place] sent at 04/19/2017  2:39 PM EDT ----- Please let her know ECC was normal, no signs of cancer. So will proceed with repeat pap in 6 months as planned.

## 2017-10-18 ENCOUNTER — Other Ambulatory Visit: Payer: Self-pay | Admitting: Obstetrics and Gynecology

## 2017-10-18 ENCOUNTER — Ambulatory Visit (INDEPENDENT_AMBULATORY_CARE_PROVIDER_SITE_OTHER): Payer: BC Managed Care – PPO | Admitting: Obstetrics and Gynecology

## 2017-10-18 ENCOUNTER — Encounter: Payer: Self-pay | Admitting: Obstetrics and Gynecology

## 2017-10-18 VITALS — BP 122/80 | HR 88 | Ht 67.5 in | Wt 177.4 lb

## 2017-10-18 DIAGNOSIS — R87619 Unspecified abnormal cytological findings in specimens from cervix uteri: Secondary | ICD-10-CM

## 2017-10-18 NOTE — Progress Notes (Signed)
Subjective:     Patient ID: Brenda CanterBeth Elmquist, female   DOB: 1965/09/23, 52 y.o.   MRN: [id number]  HPI Here for repeat pap, denies any concerns  Review of Systems Negative     Objective:   Physical Exam A&Ox4  Well groomed female in no distress Blood pressure 122/80, pulse 88, height 5' 7.5" (1.715 m), weight 177 lb 6.4 oz (80.5 kg). Pelvic exam: normal external genitalia, vulva, vagina, cervix, uterus and adnexa, VAGINA: normal appearing vagina with normal color and discharge, no lesions. Mild atrophy noted    Assessment:     Repeat pap due to previous abnormal pap    Plan:     Will notify of results. Plan repeat in 6 months with annual exam.  Xiao Graul,CNM

## 2017-10-19 LAB — CYTOLOGY - PAP

## 2018-03-30 ENCOUNTER — Telehealth: Payer: Self-pay | Admitting: Obstetrics and Gynecology

## 2018-03-30 NOTE — Telephone Encounter (Signed)
The patient called and stated tat she needs a call back if possible from her nurse in regards to trying/needing to get assistance with scheduling at another office. Please advise.

## 2018-04-03 NOTE — Telephone Encounter (Signed)
Advised pt to use some claritin d, zytrec d, she has appt with [facility] Th[facility]

## 2018-04-04 DIAGNOSIS — J019 Acute sinusitis, unspecified: Secondary | ICD-10-CM | POA: Diagnosis not present

## 2018-04-05 ENCOUNTER — Encounter: Payer: BC Managed Care – PPO | Admitting: Obstetrics and Gynecology

## 2018-04-11 ENCOUNTER — Other Ambulatory Visit (HOSPITAL_COMMUNITY)
Admission: RE | Admit: 2018-04-11 | Discharge: 2018-04-11 | Disposition: A | Discharge status: Home / Self Care | Payer: BC Managed Care – PPO | Source: Ambulatory Visit | Attending: Obstetrics and Gynecology | Admitting: Obstetrics and Gynecology

## 2018-04-11 ENCOUNTER — Ambulatory Visit (INDEPENDENT_AMBULATORY_CARE_PROVIDER_SITE_OTHER): Payer: BC Managed Care – PPO | Admitting: Obstetrics and Gynecology

## 2018-04-11 ENCOUNTER — Encounter: Payer: Self-pay | Admitting: Obstetrics and Gynecology

## 2018-04-11 VITALS — BP 114/73 | HR 74 | Ht 67.5 in | Wt 175.8 lb

## 2018-04-11 DIAGNOSIS — R0981 Nasal congestion: Secondary | ICD-10-CM | POA: Diagnosis not present

## 2018-04-11 DIAGNOSIS — Z8742 Personal history of other diseases of the female genital tract: Secondary | ICD-10-CM

## 2018-04-11 DIAGNOSIS — Z01419 Encounter for gynecological examination (general) (routine) without abnormal findings: Secondary | ICD-10-CM | POA: Diagnosis present

## 2018-04-11 DIAGNOSIS — Z87898 Personal history of other specified conditions: Secondary | ICD-10-CM

## 2018-04-11 MED ORDER — LORATADINE 5 MG PO CHEW: 10.0000 mg | CHEWABLE_TABLET | Freq: Every day | ORAL | 2 refills | Status: DC

## 2018-04-11 MED ORDER — PREDNISONE 5 MG/5ML PO SOLN: 20.0000 mg | Freq: Every day | ORAL | 0 refills | Status: DC

## 2018-04-11 NOTE — Progress Notes (Signed)
Subjective:   Brenda Mosley is a 52 y.o. G25P0010 Caucasian female here for a routine well-woman exam.  No LMP recorded. (Menstrual status: Perimenopausal).    Current complaints: reports sinusitis with chest congestion for 4 weeks, seen at [facility] initially then by PCP a few days ago. Still having fatigue and pressure in ears/sinuses. Is due to fly to [place] in 3 days for work and afraid will have problems with ears and cabin pressure changes.  PCP: [facility]ers       does desire labs  Social History: Sexual: heterosexual Marital Status: divorced Living situation: alone Occupation: Educational psychologist for hearing impaired Tobacco/alcohol: no tobacco use Illicit drugs: no history of illicit drug use  The following portions of the patient's history were reviewed and updated as appropriate: allergies, current medications, past family history, past medical history, past social history, past surgical history and problem list.  Past Medical History Past Medical History:  Diagnosis Date  . DVT (deep venous thrombosis) (HCC)    rt calf treatment 8months due to oc's    Past Surgical History Past Surgical History:  Procedure Laterality Date  . BREAST ENHANCEMENT SURGERY Bilateral 8/09   saline  . LAPAROSCOPY      Gynecologic History G1P0010  No LMP recorded. (Menstrual status: Perimenopausal). Contraception: abstinence and post menopausal status Last Pap: 2018. Results were: normal Last mammogram: 2017. Results were: normal   Obstetric History OB History  Gravida Para Term Preterm AB Living  1       1 0  SAB TAB Ectopic Multiple Live Births  1            # Outcome Date GA Lbr Len/2nd Weight Sex Delivery Anes PTL Lv  1 SAB 1993            Current Medications Current Outpatient Medications on File Prior to Visit  Medication Sig Dispense Refill  . amoxicillin-clavulanate (AUGMENTIN) 200-28.5 MG/5ML suspension Take by mouth 2 (two) times daily.    Marland Kitchen thiamine (VITAMIN B-1) 100 MG  tablet Take 100 mg by mouth daily.    . Vitamin D, Cholecalciferol, 1000 UNITS TABS Take 2 tablets by mouth daily.     . B Complex Vitamins (B COMPLEX PO) Take by mouth.    . oxyCODONE (ROXICODONE) 5 MG/5ML solution Take 5 mLs (5 mg total) by mouth every 8 (eight) hours as needed for severe pain. (Patient not taking: Reported on 10/18/2017) 15 mL 0   No current facility-administered medications on file prior to visit.     Review of Systems Patient denies any blurred vision, shortness of breath, chest pain (outside of coughing), abdominal pain, problems with bowel movements, or urination.  Objective:  BP 114/73   Pulse 74   Ht 5' 7.5" (1.715 m)   Wt 175 lb 12.8 oz (79.7 kg)   BMI 27.13 kg/m  Physical Exam  General:  Well developed, well nourished, no acute distress. She is alert and oriented x3. Normal ear/nose and throat exam except some bogginess noted in nasal passages bilaterally. Skin:  Warm and dry Neck:  Midline trachea, no thyromegaly or nodules Cardiovascular: Regular rate and rhythm, no murmur heard Lungs:  Effort normal, all lung fields clear to auscultation bilaterally Breasts:  No dominant palpable mass, retraction, or nipple discharge, bilateral implants noted without defect. Abdomen:  Soft, non tender, no hepatosplenomegaly or masses Pelvic:  External genitalia is normal in appearance.  The vagina is normal and atrophic in appearance. The cervix is bulbous, no CMT.  Thin  prep pap is done with HR HPV cotesting. Uterus is felt to be normal size, shape, and contour.  No adnexal masses or tenderness noted. Extremities:  No swelling or varicosities noted Psych:  She has a normal mood and affect  Assessment:   Healthy well-woman exam Sinus congestion  Plan:  Labs obtained and will follow up accordingly Prednisone liquid prescribed as it worked well for sinus congestion in past. Note to excuse from upcoming flight for work due to congestion Recommended switching to  claritin chewables from zyrtec due to side effect of fatigue F/U 1 year for AE, or sooner if needed Mammogram done in April per patient  Foday Cone Suzan Nailer, CNM

## 2018-04-12 LAB — COMPREHENSIVE METABOLIC PANEL
A/G RATIO: 2.5 — AB (ref 1.2–2.2)
ALT: 30 IU/L (ref 0–32)
AST: 31 IU/L (ref 0–40)
Albumin: 4.7 g/dL (ref 3.5–5.5)
Alkaline Phosphatase: 60 IU/L (ref 39–117)
BILIRUBIN TOTAL: 1 mg/dL (ref 0.0–1.2)
BUN / CREAT RATIO: 16 (ref 9–23)
BUN: 15 mg/dL (ref 6–24)
CALCIUM: 10.6 mg/dL — AB (ref 8.7–10.2)
CO2: 29 mmol/L (ref 20–29)
CREATININE: 0.95 mg/dL (ref 0.57–1.00)
Chloride: 102 mmol/L (ref 96–106)
GFR, EST AFRICAN AMERICAN: 79 mL/min/{1.73_m2} (ref >59)
GFR, EST NON AFRICAN AMERICAN: 68 mL/min/{1.73_m2} (ref >59)
GLUCOSE: 93 mg/dL (ref 65–99)
Globulin, Total: 1.9 g/dL (ref 1.5–4.5)
Potassium: 3.8 mmol/L (ref 3.5–5.2)
SODIUM: 145 mmol/L — AB (ref 134–144)
Total Protein: 6.6 g/dL (ref 6.0–8.5)

## 2018-04-12 LAB — LIPID PANEL
Chol/HDL Ratio: 2.5 ratio (ref 0.0–4.4)
Cholesterol, Total: 184 mg/dL (ref 100–199)
HDL: 73 mg/dL (ref >39)
LDL CALC: 87 mg/dL (ref 0–99)
Triglycerides: 119 mg/dL (ref 0–149)
VLDL Cholesterol Cal: 24 mg/dL (ref 5–40)

## 2018-04-12 LAB — CBC
Hematocrit: 44.8 % (ref 34.0–46.6)
Hemoglobin: 15 g/dL (ref 11.1–15.9)
MCH: 30.6 pg (ref 26.6–33.0)
MCHC: 33.5 g/dL (ref 31.5–35.7)
MCV: 91 fL (ref 79–97)
PLATELETS: 216 10*3/uL (ref 150–450)
RBC: 4.9 x10E6/uL (ref 3.77–5.28)
RDW: 12.3 % (ref 12.3–15.4)
WBC: 6.7 10*3/uL (ref 3.4–10.8)

## 2018-04-12 LAB — HEMOGLOBIN A1C
Est. average glucose Bld gHb Est-mCnc: 108 mg/dL
Hgb A1c MFr Bld: 5.4 % (ref 4.8–5.6)

## 2018-04-12 LAB — TSH: TSH: 1.16 u[IU]/mL (ref 0.450–4.500)

## 2018-04-17 LAB — CYTOLOGY - PAP
Diagnosis: NEGATIVE
HPV 16/18/45 genotyping: NEGATIVE
HPV: DETECTED — AB

## 2018-04-19 ENCOUNTER — Telehealth: Payer: Self-pay | Admitting: Obstetrics and Gynecology

## 2018-04-19 NOTE — Telephone Encounter (Signed)
The patient is asking for the nurse/provider to contact her with her final lab results from last weeks' visit.  Please advise, thanks.

## 2018-04-20 NOTE — Telephone Encounter (Signed)
All labs mailed to pt

## 2018-07-19 IMAGING — CT CT 3D ACQUISTION WKST
4 of 5 series · 8 of 16 positions shown, 9 images · non-contrast
Comparison: 04/14/2017

CLINICAL DATA: Lateral tibial plateau fracture, fall, twisting
injury, acute pain

EXAM:
CT OF THE LOWER RIGHT EXTREMITY WITHOUT CONTRAST
TECHNIQUE: Multidetector CT imaging of the right lower extremity was performed
according to the standard protocol. Including 3D reconstructions.

[Series 2: axial st · axial · 0.36mm/px · z∈[-343,-179]mm · 3 of 83 slices shown, 4 images]
[im 1/83  soft-tissue]
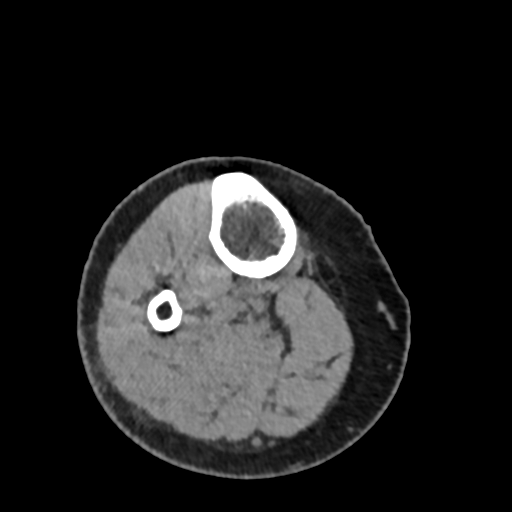
[im 1/83  bone]
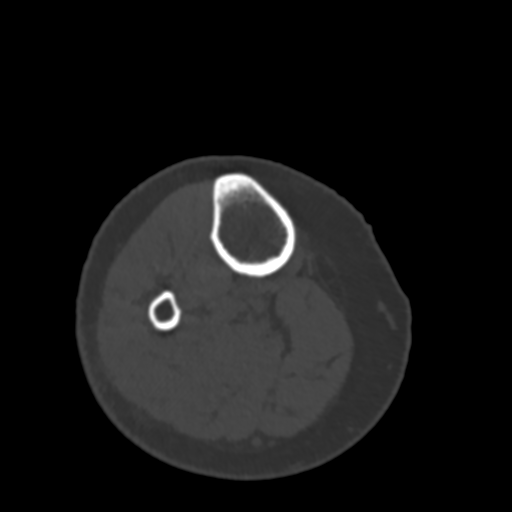
[im 42/83  soft-tissue]
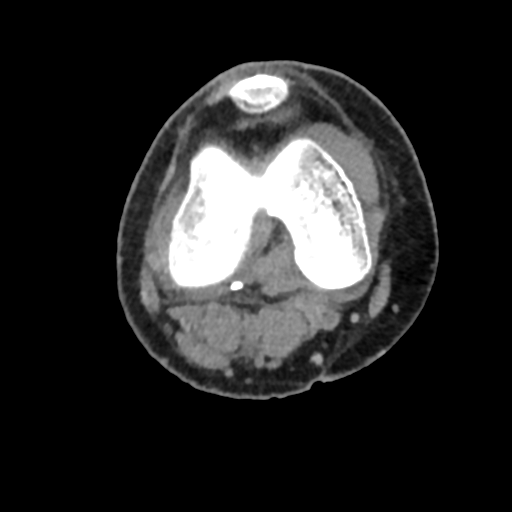
[im 83/83  soft-tissue]
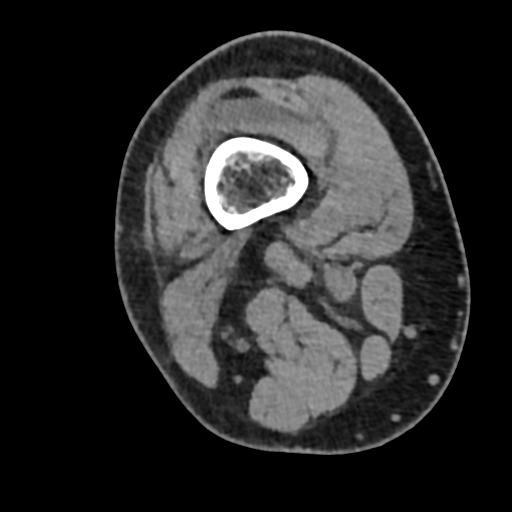

[Series 6: thins st · axial · 0.36mm/px · z∈[-332,-320]mm · 2 of 73 slices shown]
[im 25/73  soft-tissue]
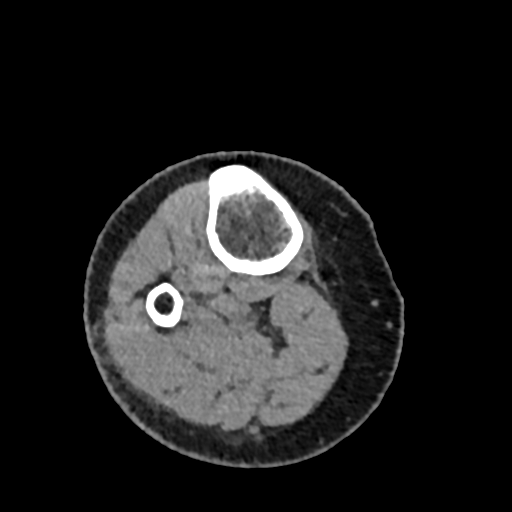
[im 49/73  soft-tissue]
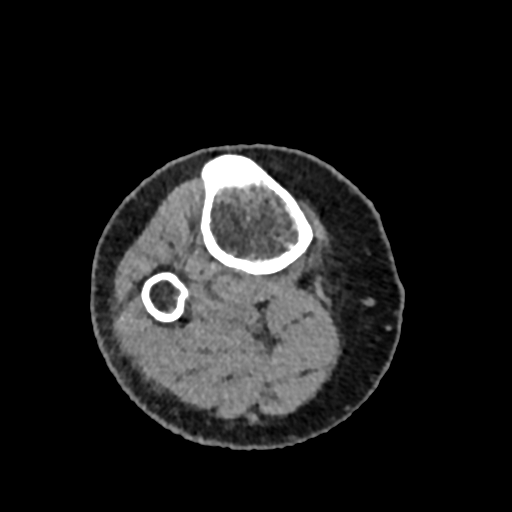

[Series 9: coronal st · coronal · 0.29mm/px · 2 of 74 slices shown]
[im 25/74  soft-tissue]
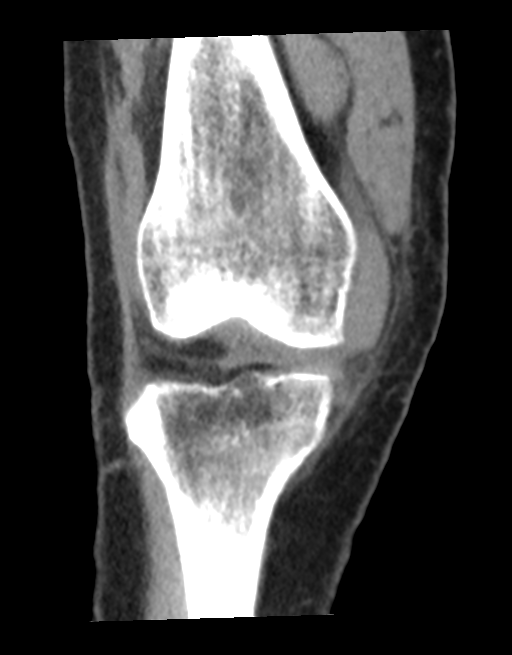
[im 49/74  soft-tissue]
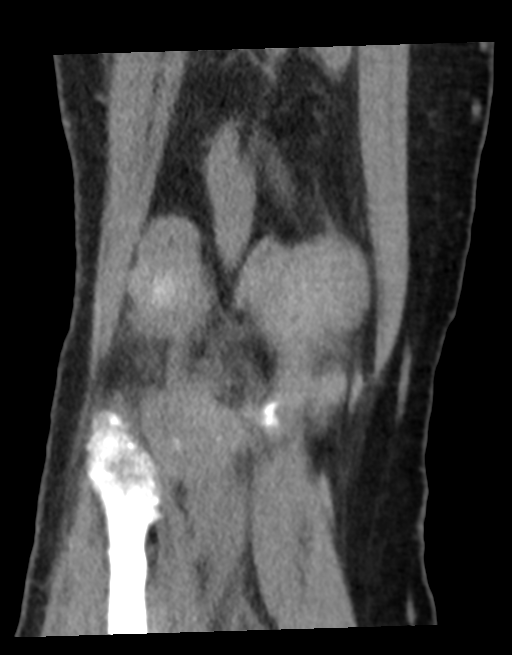

[Series 10: sagittal st · sagittal · 0.37mm/px · 1 of 54 slices shown]
[im 27/54  soft-tissue]
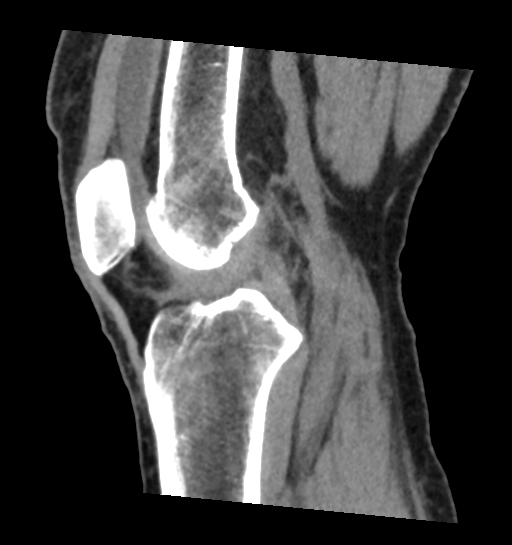

[8 of 16 positions shown; findings below may reference images not displayed]

FINDINGS: Bones/Joint/Cartilage

There is an acute lateral tibial plateau fracture posteriorly with a
minor degree of impaction (approximately 2 mm). No other acute
osseous finding of the right knee. Preserved joint spaces. No
significant arthropathy. Right knee joint effusion/ hemarthrosis
evident.

3D reconstructions confirm these findings.

Ligaments

Suboptimally assessed by CT.

Muscles and Tendons

No significant abnormality by noncontrast CT.

Soft tissues

Minor subcutaneous soft tissue swelling. No other significant
abnormality.
IMPRESSION: Acute lateral tibial plateau fracture posteriorly with a very minor
degree of impaction. Associated lipohemarthrosis. This correlates
with the plain radiographs.

## 2019-01-04 LAB — HM MAMMOGRAPHY: HM Mammogram: NORMAL (ref 0–4)

## 2019-01-09 ENCOUNTER — Encounter: Payer: Self-pay | Admitting: Internal Medicine

## 2019-03-18 ENCOUNTER — Other Ambulatory Visit: Payer: Self-pay

## 2019-03-18 DIAGNOSIS — Z20822 Contact with and (suspected) exposure to covid-19: Secondary | ICD-10-CM

## 2019-03-19 LAB — NOVEL CORONAVIRUS, NAA: SARS-CoV-2, NAA: NOT DETECTED

## 2019-04-16 ENCOUNTER — Ambulatory Visit (INDEPENDENT_AMBULATORY_CARE_PROVIDER_SITE_OTHER): Payer: BC Managed Care – PPO | Admitting: Obstetrics and Gynecology

## 2019-04-16 ENCOUNTER — Other Ambulatory Visit: Payer: Self-pay

## 2019-04-16 ENCOUNTER — Other Ambulatory Visit (HOSPITAL_COMMUNITY)
Admission: RE | Admit: 2019-04-16 | Discharge: 2019-04-16 | Disposition: A | Discharge status: Home / Self Care | Payer: BC Managed Care – PPO | Source: Ambulatory Visit | Attending: Obstetrics and Gynecology | Admitting: Obstetrics and Gynecology

## 2019-04-16 ENCOUNTER — Encounter: Payer: Self-pay | Admitting: Obstetrics and Gynecology

## 2019-04-16 VITALS — BP 128/78 | HR 98 | Ht 67.5 in | Wt 170.2 lb

## 2019-04-16 DIAGNOSIS — Z01419 Encounter for gynecological examination (general) (routine) without abnormal findings: Secondary | ICD-10-CM

## 2019-04-16 NOTE — Progress Notes (Signed)
Subjective:   Brenda Mosley is a 53 y.o. G34P0010 Caucasian female here for a routine well-woman exam.  No LMP recorded. (Menstrual status: Perimenopausal).    Current complaints: increased stress with pandemic and working remotely. Right knee patella fracture. Is doing a current low carb "Octavia diet" for last 6 weeks.  PCP: [facility]       does desire labs  Social History: Sexual: heterosexual Marital Status: divorced Living situation: with boyfriend Occupation: employment placement for hearing impaired. Tobacco/alcohol: no tobacco use Illicit drugs: no history of illicit drug use  The following portions of the patient's history were reviewed and updated as appropriate: allergies, current medications, past family history, past medical history, past social history, past surgical history and problem list.  Past Medical History Past Medical History:  Diagnosis Date  . DVT (deep venous thrombosis) (HCC)    rt calf treatment 63months due to oc's    Past Surgical History Past Surgical History:  Procedure Laterality Date  . BREAST ENHANCEMENT SURGERY Bilateral 8/09   saline  . LAPAROSCOPY      Gynecologic History G1P0010  No LMP recorded. (Menstrual status: Perimenopausal). Contraception: post menopausal status Last Pap: 04/2018. Results were: neg/HPV+ Last mammogram: 12/2018. Results were: normal   Obstetric History OB History  Gravida Para Term Preterm AB Living  1       1 0  SAB TAB Ectopic Multiple Live Births  1            # Outcome Date GA Lbr Len/2nd Weight Sex Delivery Anes PTL Lv  1 SAB 1993            Current Medications Current Outpatient Medications on File Prior to Visit  Medication Sig Dispense Refill  . predniSONE 5 MG/5ML solution Take 20 mLs (20 mg total) by mouth daily with breakfast. (Patient not taking: Reported on 04/16/2019) 100 mL 0   No current facility-administered medications on file prior to visit.     Review of Systems Patient denies any  headaches, blurred vision, shortness of breath, chest pain, abdominal pain, problems with bowel movements, urination, or intercourse.  Objective:  BP 128/78   Pulse 98   Ht 5' 7.5" (1.715 m)   Wt 170 lb 3.2 oz (77.2 kg)   BMI 26.26 kg/m  Physical Exam  General:  Well developed, well nourished, no acute distress. She is alert and oriented x3. Skin:  Warm and dry Neck:  Midline trachea, no thyromegaly or nodules Cardiovascular: Regular rate and rhythm, no murmur heard Lungs:  Effort normal, all lung fields clear to auscultation bilaterally Breasts:  No dominant palpable mass, retraction, or nipple discharge Abdomen:  Soft, non tender, no hepatosplenomegaly or masses Pelvic:  External genitalia is normal in appearance.  The vagina is normal in appearance. The cervix is bulbous, no CMT.  Thin prep pap is done with HR HPV cotesting. Uterus is felt to be normal size, shape, and contour.  No adnexal masses or tenderness noted. Extremities:  No swelling or varicosities noted Psych:  She has a normal mood and affect  Assessment:   Healthy well-woman exam BMI 26  Plan:  Labs obtained- will follow up accordingly Declined flu vaccine F/U 1 year for AE, or sooner if needed Mammogram up to date Colonoscopy declined- but willing to do cologard   Rockney Ghee, CNM

## 2019-04-17 LAB — CBC
Hematocrit: 44.2 % (ref 34.0–46.6)
Hemoglobin: 14.9 g/dL (ref 11.1–15.9)
MCH: 30.4 pg (ref 26.6–33.0)
MCHC: 33.7 g/dL (ref 31.5–35.7)
MCV: 90 fL (ref 79–97)
Platelets: 176 10*3/uL (ref 150–450)
RBC: 4.9 x10E6/uL (ref 3.77–5.28)
RDW: 12.8 % (ref 11.7–15.4)
WBC: 6.5 10*3/uL (ref 3.4–10.8)

## 2019-04-17 LAB — HEMOGLOBIN A1C
Est. average glucose Bld gHb Est-mCnc: 103 mg/dL
Hgb A1c MFr Bld: 5.2 % (ref 4.8–5.6)

## 2019-04-17 LAB — COMPREHENSIVE METABOLIC PANEL WITH GFR
ALT: 19 IU/L (ref 0–32)
AST: 24 IU/L (ref 0–40)
Albumin/Globulin Ratio: 2.5 — ABNORMAL HIGH (ref 1.2–2.2)
Albumin: 4.9 g/dL (ref 3.8–4.9)
Alkaline Phosphatase: 61 IU/L (ref 39–117)
BUN/Creatinine Ratio: 24 — ABNORMAL HIGH (ref 9–23)
BUN: 20 mg/dL (ref 6–24)
Bilirubin Total: 1.4 mg/dL — ABNORMAL HIGH (ref 0.0–1.2)
CO2: 26 mmol/L (ref 20–29)
Calcium: 10.6 mg/dL — ABNORMAL HIGH (ref 8.7–10.2)
Chloride: 101 mmol/L (ref 96–106)
Creatinine, Ser: 0.85 mg/dL (ref 0.57–1.00)
GFR calc Af Amer: 89 mL/min/1.73
GFR calc non Af Amer: 77 mL/min/1.73
Globulin, Total: 2 g/dL (ref 1.5–4.5)
Glucose: 81 mg/dL (ref 65–99)
Potassium: 4.2 mmol/L (ref 3.5–5.2)
Sodium: 141 mmol/L (ref 134–144)
Total Protein: 6.9 g/dL (ref 6.0–8.5)

## 2019-04-17 LAB — LIPID PANEL
Chol/HDL Ratio: 2.8 ratio (ref 0.0–4.4)
Cholesterol, Total: 186 mg/dL (ref 100–199)
HDL: 67 mg/dL (ref >39)
LDL Chol Calc (NIH): 107 mg/dL — ABNORMAL HIGH (ref 0–99)
Triglycerides: 66 mg/dL (ref 0–149)
VLDL Cholesterol Cal: 12 mg/dL (ref 5–40)

## 2019-04-17 LAB — VITAMIN D 25 HYDROXY (VIT D DEFICIENCY, FRACTURES): Vit D, 25-Hydroxy: 67.6 ng/mL (ref 30.0–100.0)

## 2019-04-17 LAB — TSH: TSH: 1.72 u[IU]/mL (ref 0.450–4.500)

## 2019-04-29 LAB — CYTOLOGY - PAP
Diagnosis: NEGATIVE
HPV 16: NEGATIVE
HPV 18 / 45: NEGATIVE
High risk HPV: POSITIVE — AB
Molecular Disclaimer: 56
Molecular Disclaimer: NEGATIVE
Molecular Disclaimer: NORMAL

## 2019-05-01 ENCOUNTER — Telehealth: Payer: Self-pay | Admitting: Obstetrics and Gynecology

## 2019-05-01 NOTE — Telephone Encounter (Signed)
The patient called and stated that she has not received her stool test kit in the mail yet. Pt verified that address is correct. Pt also stated that she needs to speak with her nurse in regards to her wanting to discuss her blood results. The patient stated  That her results are different from last year and she has some concerns. Please advise.  °

## 2019-05-06 ENCOUNTER — Ambulatory Visit
Admission: RE | Admit: 2019-05-06 | Discharge: 2019-05-06 | Disposition: A | Discharge status: Home / Self Care | Payer: BC Managed Care – PPO | Source: Ambulatory Visit | Attending: Obstetrics and Gynecology | Admitting: Obstetrics and Gynecology

## 2019-05-06 DIAGNOSIS — Z01419 Encounter for gynecological examination (general) (routine) without abnormal findings: Secondary | ICD-10-CM | POA: Diagnosis not present

## 2019-05-06 NOTE — Telephone Encounter (Signed)
Sent pt my chart message about cologuard

## 2019-05-30 ENCOUNTER — Telehealth: Payer: Self-pay | Admitting: Obstetrics and Gynecology

## 2019-05-30 NOTE — Telephone Encounter (Signed)
The patient called and stated that the incorrect code was entered for her labs, and now her insurance is not covering the labs. Pt is requesting a call back to confirm new orders/code has been resubmitted. Please advise.

## 2019-05-31 NOTE — Telephone Encounter (Signed)
Please add code for h/o vit D def for vit D lab, and BMI 26 for Aic, all other are routine screening and should be covered under the preventitive care code placed.

## 2019-05-31 NOTE — Telephone Encounter (Signed)
Pt aware dx code added to labs.

## 2019-06-05 LAB — COLOGUARD

## 2019-09-06 ENCOUNTER — Telehealth: Payer: Self-pay

## 2019-09-06 NOTE — Telephone Encounter (Signed)
Pt called and is aware of her cologuard test results and that they were negative.

## 2020-04-08 ENCOUNTER — Other Ambulatory Visit: Payer: BC Managed Care – PPO

## 2020-04-08 ENCOUNTER — Other Ambulatory Visit: Payer: Self-pay

## 2020-04-08 DIAGNOSIS — Z20822 Contact with and (suspected) exposure to covid-19: Secondary | ICD-10-CM

## 2020-04-10 LAB — SARS-COV-2, NAA 2 DAY TAT

## 2020-04-10 LAB — NOVEL CORONAVIRUS, NAA: SARS-CoV-2, NAA: NOT DETECTED

## 2020-04-15 ENCOUNTER — Other Ambulatory Visit: Payer: Self-pay | Admitting: Radiology

## 2020-04-15 ENCOUNTER — Other Ambulatory Visit: Payer: BC Managed Care – PPO

## 2020-04-15 DIAGNOSIS — Z20822 Contact with and (suspected) exposure to covid-19: Secondary | ICD-10-CM

## 2020-04-16 LAB — SARS-COV-2, NAA 2 DAY TAT

## 2020-04-16 LAB — NOVEL CORONAVIRUS, NAA: SARS-CoV-2, NAA: NOT DETECTED

## 2020-04-20 ENCOUNTER — Encounter: Payer: BC Managed Care – PPO | Admitting: Certified Nurse Midwife

## 2020-04-22 ENCOUNTER — Other Ambulatory Visit: Payer: BC Managed Care – PPO

## 2020-04-22 DIAGNOSIS — Z20822 Contact with and (suspected) exposure to covid-19: Secondary | ICD-10-CM

## 2020-04-23 LAB — SARS-COV-2, NAA 2 DAY TAT

## 2020-04-23 LAB — NOVEL CORONAVIRUS, NAA: SARS-CoV-2, NAA: NOT DETECTED

## 2020-04-29 ENCOUNTER — Other Ambulatory Visit: Payer: Self-pay | Admitting: Radiology

## 2020-04-29 ENCOUNTER — Other Ambulatory Visit: Payer: BC Managed Care – PPO

## 2020-04-29 DIAGNOSIS — Z20822 Contact with and (suspected) exposure to covid-19: Secondary | ICD-10-CM

## 2020-04-30 LAB — NOVEL CORONAVIRUS, NAA: SARS-CoV-2, NAA: NOT DETECTED

## 2020-04-30 LAB — SARS-COV-2, NAA 2 DAY TAT

## 2020-05-06 ENCOUNTER — Other Ambulatory Visit: Payer: BC Managed Care – PPO

## 2020-05-07 ENCOUNTER — Other Ambulatory Visit: Payer: Self-pay

## 2020-05-07 ENCOUNTER — Other Ambulatory Visit: Payer: BC Managed Care – PPO

## 2020-05-07 DIAGNOSIS — Z20822 Contact with and (suspected) exposure to covid-19: Secondary | ICD-10-CM

## 2020-05-08 LAB — NOVEL CORONAVIRUS, NAA: SARS-CoV-2, NAA: NOT DETECTED

## 2020-05-08 LAB — SARS-COV-2, NAA 2 DAY TAT

## 2020-05-13 ENCOUNTER — Other Ambulatory Visit: Payer: BC Managed Care – PPO

## 2020-05-13 DIAGNOSIS — Z20822 Contact with and (suspected) exposure to covid-19: Secondary | ICD-10-CM

## 2020-05-14 LAB — NOVEL CORONAVIRUS, NAA: SARS-CoV-2, NAA: NOT DETECTED

## 2020-05-14 LAB — SARS-COV-2, NAA 2 DAY TAT

## 2020-05-18 ENCOUNTER — Encounter: Payer: Self-pay | Admitting: Certified Nurse Midwife

## 2020-05-18 ENCOUNTER — Other Ambulatory Visit (HOSPITAL_COMMUNITY)
Admission: RE | Admit: 2020-05-18 | Discharge: 2020-05-18 | Disposition: A | Discharge status: Home / Self Care | Payer: BC Managed Care – PPO | Source: Ambulatory Visit | Attending: Certified Nurse Midwife | Admitting: Certified Nurse Midwife

## 2020-05-18 ENCOUNTER — Ambulatory Visit (INDEPENDENT_AMBULATORY_CARE_PROVIDER_SITE_OTHER): Payer: BC Managed Care – PPO | Admitting: Certified Nurse Midwife

## 2020-05-18 ENCOUNTER — Other Ambulatory Visit: Payer: Self-pay

## 2020-05-18 VITALS — BP 120/81 | HR 74 | Ht 67.5 in | Wt 182.3 lb

## 2020-05-18 DIAGNOSIS — Z1231 Encounter for screening mammogram for malignant neoplasm of breast: Secondary | ICD-10-CM | POA: Diagnosis not present

## 2020-05-18 DIAGNOSIS — Z124 Encounter for screening for malignant neoplasm of cervix: Secondary | ICD-10-CM | POA: Insufficient documentation

## 2020-05-18 DIAGNOSIS — Z01419 Encounter for gynecological examination (general) (routine) without abnormal findings: Secondary | ICD-10-CM | POA: Insufficient documentation

## 2020-05-18 DIAGNOSIS — E669 Obesity, unspecified: Secondary | ICD-10-CM | POA: Insufficient documentation

## 2020-05-18 NOTE — Patient Instructions (Signed)

## 2020-05-18 NOTE — Progress Notes (Signed)
GYNECOLOGY ANNUAL PREVENTATIVE CARE ENCOUNTER NOTE  History:     Brenda Mosley is a 54 y.o. G21P0010 female here for a routine annual gynecologic exam.  Current complaints: none   Denies abnormal vaginal bleeding, discharge, pelvic pain, problems with intercourse or other gynecologic concerns.     Social Relationship: boyfriend Living:boyfriend and dog (lab pit mix) Work: for the [employer] Exercise: walking  Smoke/Alcohol/drug JJO:ACZYSA  Gynecologic History Patient's last menstrual period was 07/25/2015. Contraception: post menopausal status Last Pap: 04/16/19. Results were: normal with positive HPV Last mammogram: 01/04/19 Results were: normal   The pregnancy intention screening data noted above was reviewed. Potential methods of contraception were discussed. The patient elected to proceed with No Method - Other Reason.   Obstetric History OB History  Gravida Para Term Preterm AB Living  1       1 0  SAB TAB Ectopic Multiple Live Births  1            # Outcome Date GA Lbr Len/2nd Weight Sex Delivery Anes PTL Lv  1 SAB 1993            Past Medical History:  Diagnosis Date  . DVT (deep venous thrombosis) (HCC)    rt calf treatment 62months due to oc's    Past Surgical History:  Procedure Laterality Date  . BREAST ENHANCEMENT SURGERY Bilateral 8/09   saline  . LAPAROSCOPY      Current Outpatient Medications on File Prior to Visit  Medication Sig Dispense Refill  . predniSONE 5 MG/5ML solution Take 20 mLs (20 mg total) by mouth daily with breakfast. (Patient not taking: Reported on 04/16/2019) 100 mL 0   No current facility-administered medications on file prior to visit.    Allergies  Allergen Reactions  . Epinephrine Other (See Comments)  . Tylenol With Codeine #3 [Acetaminophen-Codeine]     Makes her hyper    Social History:  reports that she has never smoked. She has never used smokeless tobacco. She reports that she does not  drink alcohol and does not use drugs.  Family History  Problem Relation Age of Onset  . Heart disease Mother   . Hodgkin's lymphoma Father     The following portions of the patient's history were reviewed and updated as appropriate: allergies, current medications, past family history, past medical history, past social history, past surgical history and problem list.  Review of Systems Pertinent items noted in HPI and remainder of comprehensive ROS otherwise negative.  Physical Exam:  LMP 07/25/2015  CONSTITUTIONAL: Well-developed, well-nourished female in no acute distress.  HENT:  Normocephalic, atraumatic, External right and left ear normal. Oropharynx is clear and moist EYES: Conjunctivae and EOM are normal. Pupils are equal, round, and reactive to light. No scleral icterus.  NECK: Normal range of motion, supple, no masses.  Normal thyroid.  SKIN: Skin is warm and dry. No rash noted. Not diaphoretic. No erythema. No pallor. MUSCULOSKELETAL: Normal range of motion. No tenderness.  No cyanosis, clubbing, or edema.  2+ distal pulses. NEUROLOGIC: Alert and oriented to person, place, and time. Normal reflexes, muscle tone coordination.  PSYCHIATRIC: Normal mood and affect. Normal behavior. Normal judgment and thought content. CARDIOVASCULAR: Normal heart rate noted, regular rhythm RESPIRATORY: Clear to auscultation bilaterally. Effort and breath sounds normal, no problems with respiration noted. BREASTS: Symmetric in size. No masses, tenderness, skin changes, nipple drainage, or lymphadenopathy bilaterally.  ABDOMEN: Soft, no distention noted.  No tenderness, rebound or guarding.  PELVIC: Normal appearing external genitalia and urethral meatus; normal appearing vaginal mucosa and cervix.  No abnormal discharge noted.  Pap smear obtained.  Normal uterine size, no other palpable masses, no uterine or adnexal tenderness.  .   Assessment and Plan:  Annual GYN exam  Pap:Will follow up  results of pap smear and manage accordingly. Mammogram :ordered Labs: none, declines HIV  Refills:none Referral:none Routine preventative health maintenance measures emphasized. Please refer to After Visit Summary for other counseling recommendations.      Doreene Burke, CNM [facility],  [facility]

## 2020-05-20 ENCOUNTER — Other Ambulatory Visit: Payer: BC Managed Care – PPO

## 2020-05-20 DIAGNOSIS — Z20822 Contact with and (suspected) exposure to covid-19: Secondary | ICD-10-CM

## 2020-05-21 LAB — NOVEL CORONAVIRUS, NAA: SARS-CoV-2, NAA: NOT DETECTED

## 2020-05-21 LAB — SARS-COV-2, NAA 2 DAY TAT

## 2020-05-25 LAB — CYTOLOGY - PAP
Comment: NEGATIVE
Comment: NEGATIVE
Diagnosis: UNDETERMINED — AB
HPV 16: NEGATIVE
HPV 18 / 45: NEGATIVE
High risk HPV: POSITIVE — AB

## 2020-05-27 ENCOUNTER — Other Ambulatory Visit: Payer: BC Managed Care – PPO

## 2020-05-27 DIAGNOSIS — Z20822 Contact with and (suspected) exposure to covid-19: Secondary | ICD-10-CM

## 2020-05-29 LAB — NOVEL CORONAVIRUS, NAA: SARS-CoV-2, NAA: NOT DETECTED

## 2020-05-29 LAB — SARS-COV-2, NAA 2 DAY TAT

## 2020-06-01 ENCOUNTER — Telehealth: Payer: Self-pay

## 2020-06-01 NOTE — Telephone Encounter (Signed)
Voicemail message left for patient in response to her mychart message about colposcopy.

## 2020-06-03 ENCOUNTER — Other Ambulatory Visit: Payer: BC Managed Care – PPO

## 2020-06-03 DIAGNOSIS — Z20822 Contact with and (suspected) exposure to covid-19: Secondary | ICD-10-CM

## 2020-06-04 LAB — NOVEL CORONAVIRUS, NAA: SARS-CoV-2, NAA: NOT DETECTED

## 2020-06-04 LAB — SARS-COV-2, NAA 2 DAY TAT

## 2020-06-05 NOTE — Telephone Encounter (Signed)
Pt was returning call. The pt was advised that the nurse is out of the office and that the pt said can the nurse call her back after 4 PM. I told the pt I will send a message and to allow 24- 48 hours for a reply. Please advise

## 2020-06-08 ENCOUNTER — Telehealth: Payer: Self-pay

## 2020-06-08 NOTE — Telephone Encounter (Signed)
Voicemail message left for patient regarding colposcopy. Information sheet mailed to patient.

## 2020-06-10 ENCOUNTER — Other Ambulatory Visit: Payer: BC Managed Care – PPO

## 2020-06-10 DIAGNOSIS — Z20822 Contact with and (suspected) exposure to covid-19: Secondary | ICD-10-CM

## 2020-06-11 LAB — SARS-COV-2, NAA 2 DAY TAT

## 2020-06-11 LAB — NOVEL CORONAVIRUS, NAA: SARS-CoV-2, NAA: NOT DETECTED

## 2020-06-17 ENCOUNTER — Other Ambulatory Visit: Payer: BC Managed Care – PPO

## 2020-06-17 ENCOUNTER — Other Ambulatory Visit: Payer: Self-pay

## 2020-06-17 DIAGNOSIS — Z20822 Contact with and (suspected) exposure to covid-19: Secondary | ICD-10-CM

## 2020-06-18 LAB — SARS-COV-2, NAA 2 DAY TAT

## 2020-06-18 LAB — NOVEL CORONAVIRUS, NAA: SARS-CoV-2, NAA: NOT DETECTED

## 2020-06-24 ENCOUNTER — Other Ambulatory Visit: Payer: BC Managed Care – PPO

## 2020-06-24 ENCOUNTER — Other Ambulatory Visit: Payer: Self-pay

## 2020-06-24 DIAGNOSIS — Z20822 Contact with and (suspected) exposure to covid-19: Secondary | ICD-10-CM

## 2020-06-26 LAB — SARS-COV-2, NAA 2 DAY TAT

## 2020-06-26 LAB — NOVEL CORONAVIRUS, NAA: SARS-CoV-2, NAA: NOT DETECTED

## 2020-07-01 ENCOUNTER — Other Ambulatory Visit: Payer: BC Managed Care – PPO

## 2020-07-08 ENCOUNTER — Other Ambulatory Visit: Payer: BC Managed Care – PPO

## 2020-07-08 DIAGNOSIS — Z20822 Contact with and (suspected) exposure to covid-19: Secondary | ICD-10-CM

## 2020-07-09 LAB — NOVEL CORONAVIRUS, NAA: SARS-CoV-2, NAA: NOT DETECTED

## 2020-07-09 LAB — SARS-COV-2, NAA 2 DAY TAT

## 2020-07-15 ENCOUNTER — Other Ambulatory Visit: Payer: BC Managed Care – PPO

## 2020-07-15 DIAGNOSIS — Z20822 Contact with and (suspected) exposure to covid-19: Secondary | ICD-10-CM

## 2020-07-16 LAB — NOVEL CORONAVIRUS, NAA: SARS-CoV-2, NAA: NOT DETECTED

## 2020-07-16 LAB — SARS-COV-2, NAA 2 DAY TAT

## 2020-07-22 ENCOUNTER — Other Ambulatory Visit: Payer: Self-pay

## 2020-07-22 ENCOUNTER — Other Ambulatory Visit: Payer: BC Managed Care – PPO

## 2020-07-22 DIAGNOSIS — Z20822 Contact with and (suspected) exposure to covid-19: Secondary | ICD-10-CM

## 2020-07-23 LAB — SARS-COV-2, NAA 2 DAY TAT

## 2020-07-23 LAB — NOVEL CORONAVIRUS, NAA: SARS-CoV-2, NAA: NOT DETECTED

## 2020-07-28 ENCOUNTER — Other Ambulatory Visit: Payer: BC Managed Care – PPO

## 2020-07-29 ENCOUNTER — Other Ambulatory Visit: Payer: Self-pay

## 2020-07-29 ENCOUNTER — Other Ambulatory Visit: Payer: BC Managed Care – PPO

## 2020-07-29 DIAGNOSIS — Z20822 Contact with and (suspected) exposure to covid-19: Secondary | ICD-10-CM

## 2020-07-31 LAB — SPECIMEN STATUS REPORT

## 2020-07-31 LAB — NOVEL CORONAVIRUS, NAA: SARS-CoV-2, NAA: NOT DETECTED

## 2020-07-31 LAB — SARS-COV-2, NAA 2 DAY TAT

## 2020-08-05 ENCOUNTER — Other Ambulatory Visit: Payer: Self-pay

## 2020-08-05 DIAGNOSIS — Z20822 Contact with and (suspected) exposure to covid-19: Secondary | ICD-10-CM

## 2020-08-06 LAB — SARS-COV-2, NAA 2 DAY TAT

## 2020-08-06 LAB — NOVEL CORONAVIRUS, NAA: SARS-CoV-2, NAA: NOT DETECTED

## 2020-08-12 ENCOUNTER — Other Ambulatory Visit: Payer: Self-pay

## 2020-08-12 DIAGNOSIS — Z20822 Contact with and (suspected) exposure to covid-19: Secondary | ICD-10-CM

## 2020-08-14 LAB — SARS-COV-2, NAA 2 DAY TAT

## 2020-08-14 LAB — NOVEL CORONAVIRUS, NAA: SARS-CoV-2, NAA: NOT DETECTED

## 2020-08-19 ENCOUNTER — Other Ambulatory Visit: Payer: Self-pay

## 2020-08-19 DIAGNOSIS — Z20822 Contact with and (suspected) exposure to covid-19: Secondary | ICD-10-CM

## 2020-08-21 LAB — NOVEL CORONAVIRUS, NAA: SARS-CoV-2, NAA: NOT DETECTED

## 2020-08-21 LAB — SARS-COV-2, NAA 2 DAY TAT

## 2020-08-26 ENCOUNTER — Other Ambulatory Visit: Payer: Self-pay

## 2020-08-26 DIAGNOSIS — Z20822 Contact with and (suspected) exposure to covid-19: Secondary | ICD-10-CM

## 2020-08-27 LAB — NOVEL CORONAVIRUS, NAA: SARS-CoV-2, NAA: DETECTED — AB

## 2020-08-27 LAB — SARS-COV-2, NAA 2 DAY TAT

## 2020-09-02 ENCOUNTER — Other Ambulatory Visit: Payer: Self-pay

## 2020-09-09 ENCOUNTER — Other Ambulatory Visit: Payer: Self-pay

## 2020-09-16 ENCOUNTER — Other Ambulatory Visit: Payer: Self-pay

## 2020-09-23 ENCOUNTER — Other Ambulatory Visit: Payer: Self-pay

## 2020-10-27 ENCOUNTER — Telehealth: Payer: Self-pay | Admitting: Certified Nurse Midwife

## 2020-10-27 NOTE — Telephone Encounter (Signed)
New Message:   Pt states that she is starting to get short tempered and snappy which is out of her character. This is feedback that she was given from coworkers. She states that she has been through menopause. She is concerned that this is possibly side effects from Covid. I offered he an appt.

## 2020-10-27 NOTE — Telephone Encounter (Signed)
Please advise. Thanks Alexsa Flaum 

## 2020-10-28 NOTE — Telephone Encounter (Signed)
Pt aware per vm she will need an appt to discuss. Advised to call the office for an appt or send a appt request via my chart.

## 2021-01-14 ENCOUNTER — Encounter: Payer: Self-pay | Admitting: Internal Medicine

## 2021-03-09 ENCOUNTER — Emergency Department: Admission: EM | Admit: 2021-03-09 | Payer: BC Managed Care – PPO | Source: Home / Self Care

## 2021-03-09 ENCOUNTER — Other Ambulatory Visit: Payer: Self-pay

## 2021-03-09 ENCOUNTER — Encounter: Payer: Self-pay | Admitting: Certified Nurse Midwife

## 2021-03-09 ENCOUNTER — Ambulatory Visit (INDEPENDENT_AMBULATORY_CARE_PROVIDER_SITE_OTHER): Payer: BC Managed Care – PPO | Admitting: Certified Nurse Midwife

## 2021-03-09 DIAGNOSIS — R399 Unspecified symptoms and signs involving the genitourinary system: Secondary | ICD-10-CM

## 2021-03-09 LAB — POCT URINALYSIS DIPSTICK
Bilirubin, UA: NEGATIVE
Glucose, UA: NEGATIVE
Ketones, UA: 100
Nitrite, UA: POSITIVE
Protein, UA: POSITIVE — AB
Spec Grav, UA: <=1.005 — AB (ref 1.010–1.025)
Urobilinogen, UA: 0.2 E.U./dL
pH, UA: 6.5 (ref 5.0–8.0)

## 2021-03-09 MED ORDER — NITROFURANTOIN MONOHYD MACRO 100 MG PO CAPS
100.0000 mg | ORAL_CAPSULE | Freq: Two times a day (BID) | ORAL | 0 refills | Status: DC
Start: 2021-03-09 — End: 2021-03-17

## 2021-03-09 NOTE — Progress Notes (Signed)
Subjective:    Brenda Mosley is a 55 y.o. female who complains of abnormal smelling urine, dysuria, and frequency for 1 day.  Patient also complains of back pain. Patient denies fever. States she had some chills last night. Not sure if she had a fever. 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  Patient does have a history of pyelonephritis. The following portions of the patient's history were reviewed and updated as appropriate: allergies, current medications, past family history, past medical history, past social history, past surgical history, and problem list. Review of Systems Pertinent items are noted in HPI.    Objective:    LMP 07/25/2015  General: alert, cooperative, appears stated age, and fatigued  Abdomen: soft, non-tender, without masses or organomegaly in the entire abdomen  Back: back muscles are full ROM, CVA tenderness absent, lower back pain -musculoskeletal  GU: defer exam   Laboratory:  Urine dipstick shows sp gravity 1.00, trace for leukocyte esterase, positive for nitrites, 2+ for protein, and large blood .      Assessment:    UTI    Plan: Plan:    1. Medications: nitrofurantoin 2. Maintain adequate hydration, discussed use of cranberry juice 3.Red flag symptoms for pyelonephritis reviewed. 4.Urine culture sent  5. Follow up if symptoms not improving, and prn.   Doreene Burke, CNM

## 2021-03-09 NOTE — Patient Instructions (Signed)

## 2021-03-15 ENCOUNTER — Telehealth: Payer: Self-pay | Admitting: Certified Nurse Midwife

## 2021-03-15 NOTE — Telephone Encounter (Signed)
Patient called in and states she was told her urine would be sent out for a culture.  Patient states she hasn't heard anything and was just wondering what the status was on the culture.  Also, patient states since starting the [facility] she has been experiencing headaches, no appetite, no energy, and blisters on her lips.  Patient wanted to know if she should continue the medication.  Please advise.

## 2021-03-17 ENCOUNTER — Other Ambulatory Visit: Payer: Self-pay

## 2021-03-17 ENCOUNTER — Ambulatory Visit (INDEPENDENT_AMBULATORY_CARE_PROVIDER_SITE_OTHER): Payer: BC Managed Care – PPO | Admitting: Certified Nurse Midwife

## 2021-03-17 ENCOUNTER — Telehealth: Payer: Self-pay | Admitting: Certified Nurse Midwife

## 2021-03-17 VITALS — Temp 98.8°F

## 2021-03-17 DIAGNOSIS — N390 Urinary tract infection, site not specified: Secondary | ICD-10-CM | POA: Insufficient documentation

## 2021-03-17 DIAGNOSIS — R399 Unspecified symptoms and signs involving the genitourinary system: Secondary | ICD-10-CM

## 2021-03-17 LAB — POCT URINALYSIS DIPSTICK
Bilirubin, UA: NEGATIVE
Blood, UA: NEGATIVE
Glucose, UA: NEGATIVE
Ketones, UA: NEGATIVE
Leukocytes, UA: NEGATIVE
Nitrite, UA: NEGATIVE
Protein, UA: NEGATIVE
Spec Grav, UA: 1.015 (ref 1.010–1.025)
Urobilinogen, UA: 0.2 E.U./dL
pH, UA: 6 (ref 5.0–8.0)

## 2021-03-17 MED ORDER — NITROFURANTOIN MONOHYD MACRO 100 MG PO CAPS: 100.0000 mg | ORAL_CAPSULE | Freq: Two times a day (BID) | ORAL | 0 refills | Status: AC

## 2021-03-17 NOTE — Progress Notes (Signed)
Patient is here today with concerns of lower back pain. She was seen previously for UTI symptoms and prescribed Macrobid for treatment. Urine culture did not get sent off, so she is back today for urinalysis and urine culture. U/A results were negative. She has not fever, does complain of frequency, but thinks it is due to increased water intake. She has concerns that symptoms will get worse over the weekend and would like more medication. Her Macrobid will end tomorrow. We tried to explain to her that we have to wait until the urine culture results to give her further treatment. She would like for some one to palpate her back. Pattricia Boss has agreed to see do this for her today.

## 2021-03-17 NOTE — Progress Notes (Signed)
Pt presents today for urine drop off . Was seen for UTI and urine was not sent by lab for culture. She was started on Macrobid and states she noticed a difference and was feeling better after 2 days on medications. She has concern today because she continues to have back pain. She denies fever. Urine dip today is negative . She is negative for CVA tenderness bilaterally. Back pain is mid back and is centrally located. It is musculoskeletal in nature. PT admits that she has been laying around a lot since not feeling well. Encouraged tylenol or advil, heat, ice, stretches for her back. Discussed if pain does not resolve or worsen to follow up with emerg ortho. She agrees. She is very worried that uti will , Macrobid treatment extended x 3 days. Orders sent to pharmacy.   Doreene Burke, CNM

## 2021-03-17 NOTE — Telephone Encounter (Signed)
Per AT pt needs to drop off a urine for u/a and culture.   Pt has 1 more macrobid to take from appt on 8/9. Advised to complete. Pt aware if u/a is pos then AT will let us know if a new ATB needs to be called in. Minimal uti sx. Pt c/o of h/a and fever blisters on mouth and nose. Advised pcp . Pt does not have a pcp.   Appt today at 145. Pt aware culture takes 72h to process.

## 2021-03-17 NOTE — Telephone Encounter (Signed)
Pt called this morning asking to see Brenda Mosley today- states that she is in pain from UTI- pt was placed on medication. Please Advise.

## 2021-03-19 LAB — URINE CULTURE: Organism ID, Bacteria: NO GROWTH

## 2021-05-24 ENCOUNTER — Encounter: Payer: BC Managed Care – PPO | Admitting: Certified Nurse Midwife
